# Patient Record
Sex: Male | Born: 1995 | Race: Black or African American | Hispanic: No | Marital: Single | Smoking: Never smoker
Health system: Southern US, Community
[De-identification: ages and names within clinical notes are randomized; demographics above are authoritative.]

## PROBLEM LIST (undated history)

## (undated) ENCOUNTER — Ambulatory Visit (HOSPITAL_COMMUNITY): Admission: EM | Discharge status: Against Medical Advice | Payer: Self-pay

## (undated) DIAGNOSIS — J45909 Unspecified asthma, uncomplicated: Secondary | ICD-10-CM

## (undated) HISTORY — DX: Unspecified asthma, uncomplicated: J45.909

## (undated) HISTORY — PX: WISDOM TOOTH EXTRACTION: SHX21

---
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
## 2017-09-23 IMAGING — CR DG ANKLE COMPLETE 3+V*L*
3 series · 3 of 3 positions shown · non-contrast
Comparison: None.

CLINICAL DATA: LEFT ankle pain.  Basketball injury.

EXAM:
LEFT ANKLE COMPLETE - 3+ VIEW

[x ankle ap left]
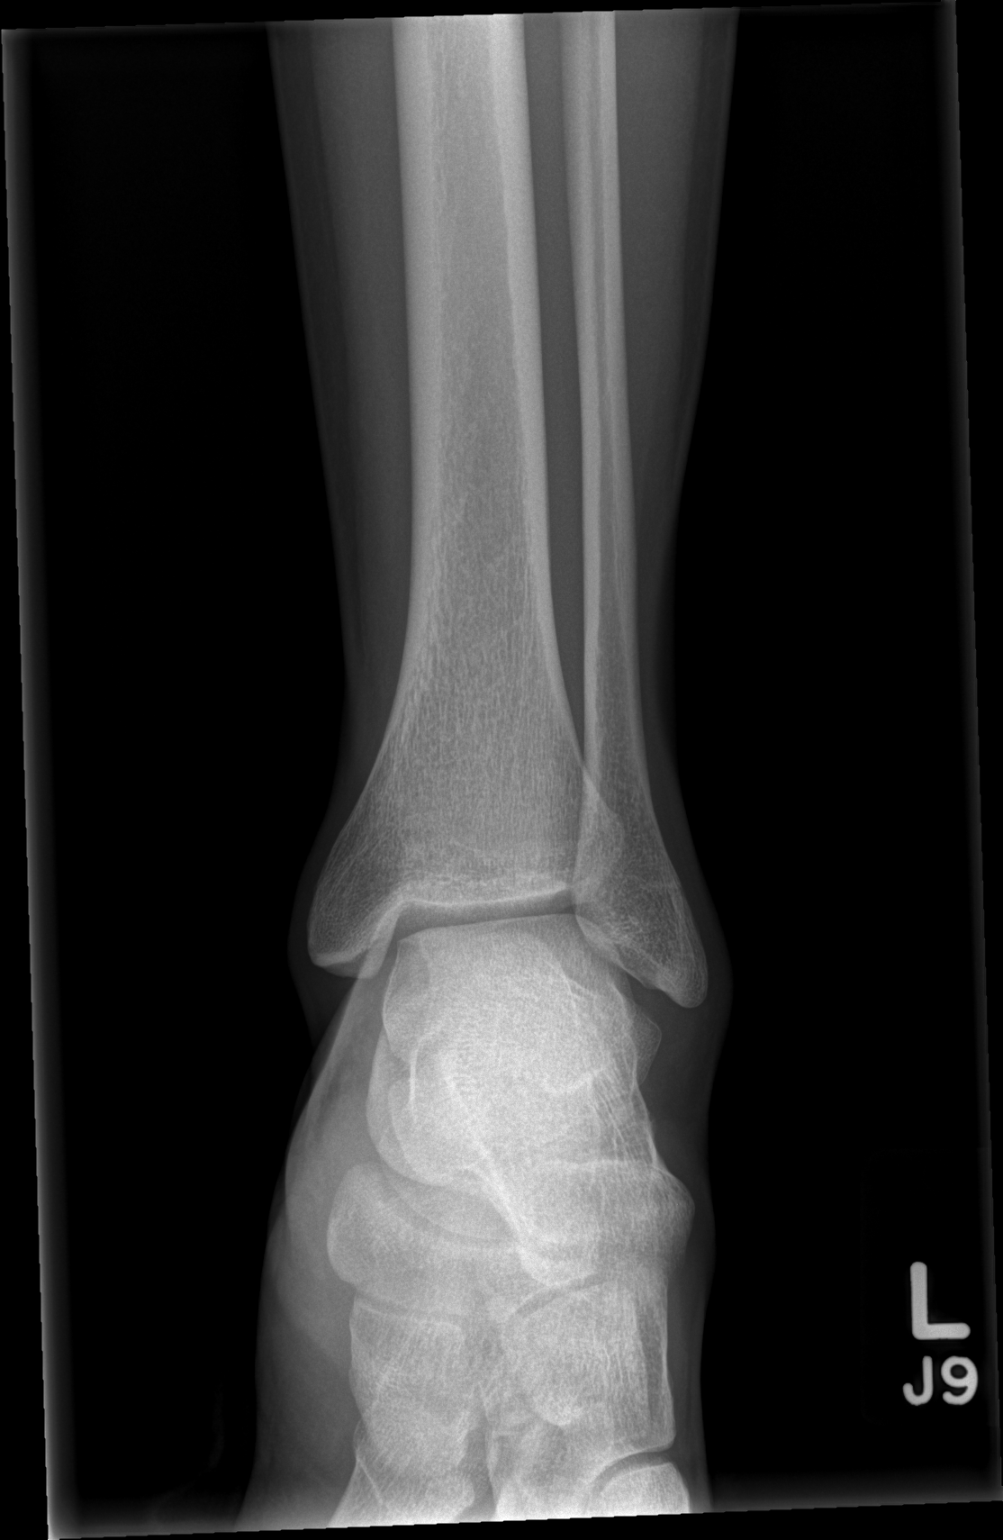

[x ankle obl left]
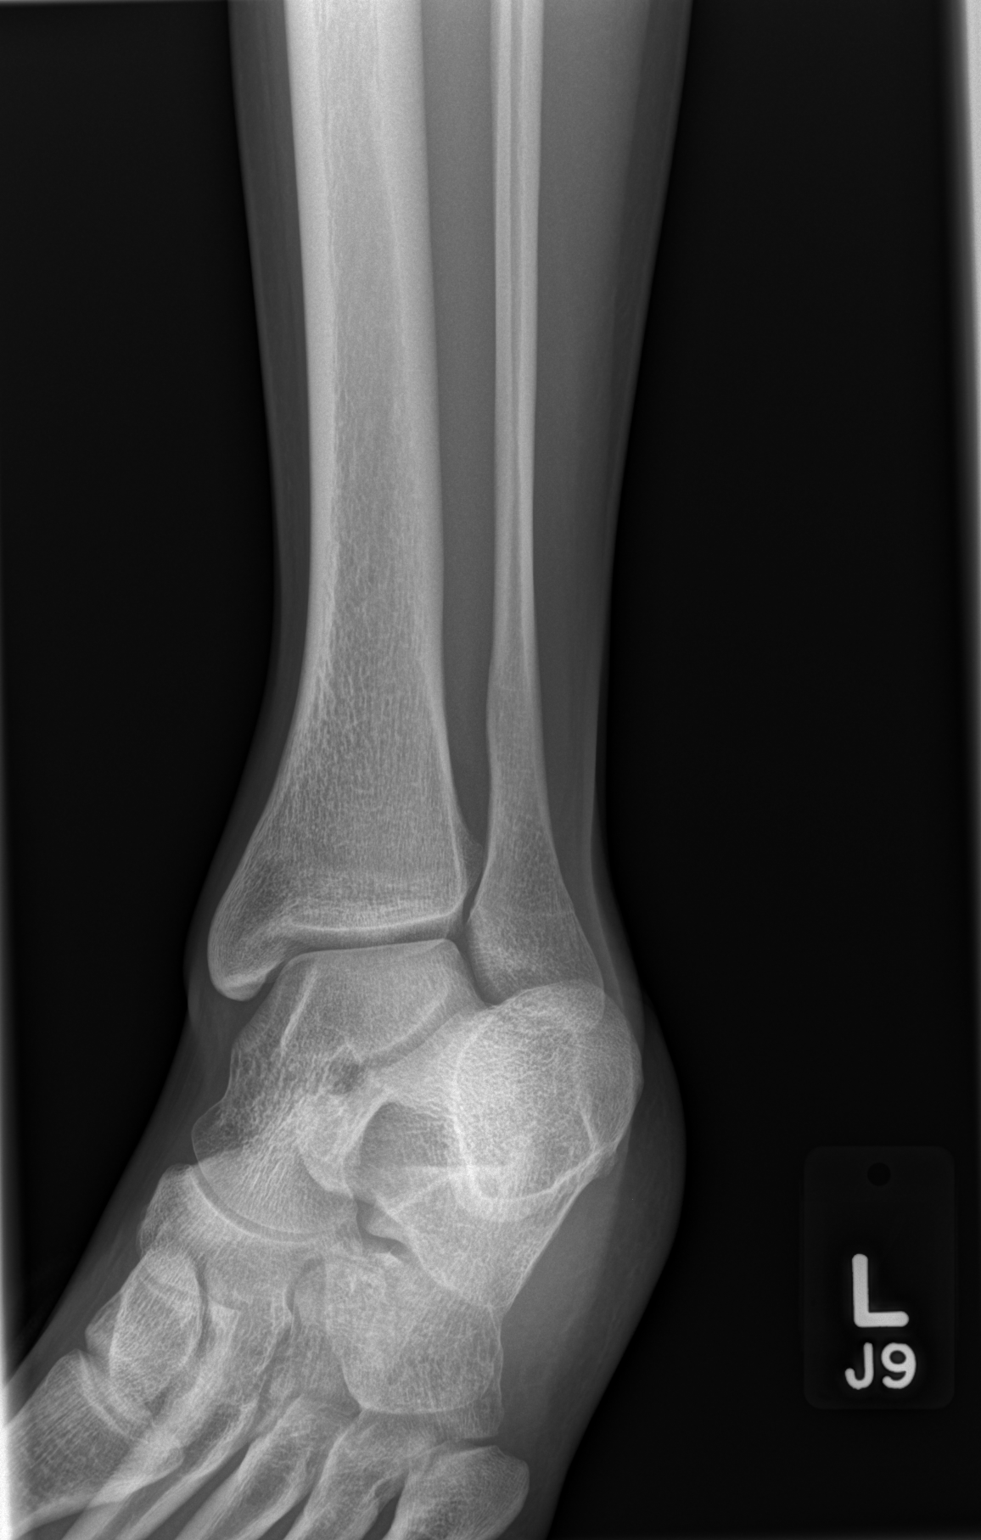

[x ankle lat left]
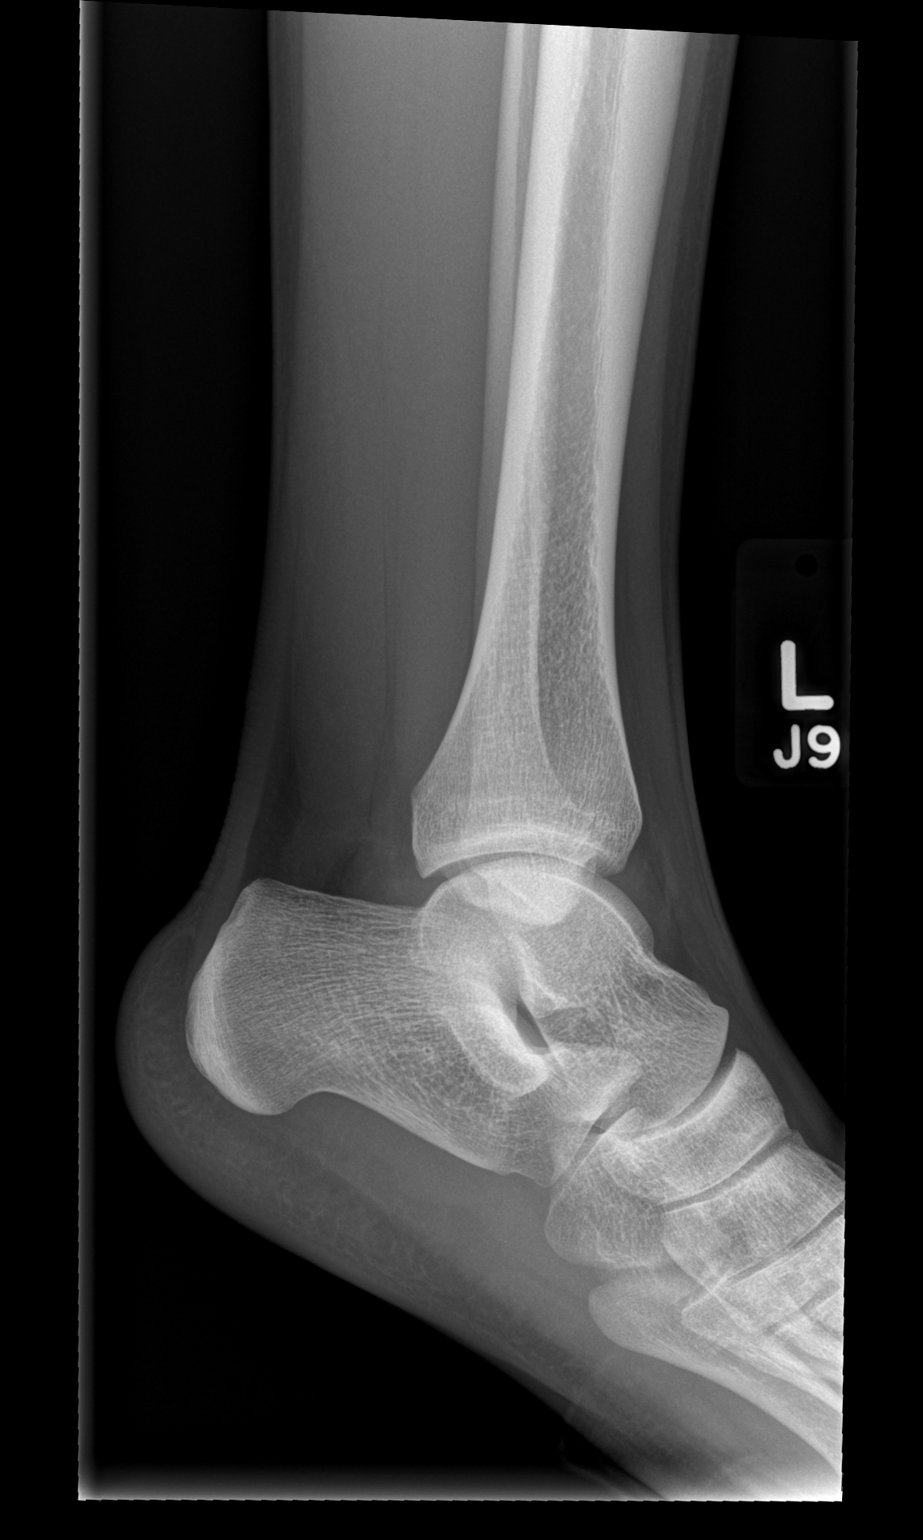

[3 of 3 positions shown; findings below may reference images not displayed]

FINDINGS: Ankle mortise intact. The talar dome is normal. No malleolar
fracture. The calcaneus is normal.
IMPRESSION: No fracture or dislocation.

## 2017-10-13 ENCOUNTER — Ambulatory Visit (INDEPENDENT_AMBULATORY_CARE_PROVIDER_SITE_OTHER): Payer: PRIVATE HEALTH INSURANCE | Admitting: Family Medicine

## 2017-10-13 ENCOUNTER — Other Ambulatory Visit (HOSPITAL_COMMUNITY)
Admission: RE | Admit: 2017-10-13 | Discharge: 2017-10-13 | Disposition: A | Discharge status: Home / Self Care | Payer: PRIVATE HEALTH INSURANCE | Source: Ambulatory Visit | Attending: Diagnostic Radiology | Admitting: Diagnostic Radiology

## 2017-10-13 ENCOUNTER — Encounter: Payer: Self-pay | Admitting: Family Medicine

## 2017-10-13 ENCOUNTER — Telehealth: Payer: Self-pay | Admitting: Family Medicine

## 2017-10-13 VITALS — BP 116/68 | HR 72 | Temp 99.2°F | Ht 67.0 in | Wt 138.0 lb

## 2017-10-13 DIAGNOSIS — Z7251 High risk heterosexual behavior: Secondary | ICD-10-CM | POA: Insufficient documentation

## 2017-10-13 DIAGNOSIS — A6001 Herpesviral infection of penis: Secondary | ICD-10-CM | POA: Diagnosis not present

## 2017-10-13 DIAGNOSIS — Z118 Encounter for screening for other infectious and parasitic diseases: Secondary | ICD-10-CM | POA: Diagnosis not present

## 2017-10-13 DIAGNOSIS — Z113 Encounter for screening for infections with a predominantly sexual mode of transmission: Secondary | ICD-10-CM | POA: Insufficient documentation

## 2017-10-13 DIAGNOSIS — Z114 Encounter for screening for human immunodeficiency virus [HIV]: Secondary | ICD-10-CM

## 2017-10-13 DIAGNOSIS — A6000 Herpesviral infection of urogenital system, unspecified: Secondary | ICD-10-CM | POA: Insufficient documentation

## 2017-10-13 MED ORDER — VALACYCLOVIR HCL 1 G PO TABS: 1000.0000 mg | ORAL_TABLET | Freq: Two times a day (BID) | ORAL | 0 refills | Status: DC

## 2017-10-13 NOTE — Telephone Encounter (Signed)
[facility] [address] [facility] on [facility].

## 2017-10-13 NOTE — Assessment & Plan Note (Addendum)
STD testing/unprotected sex/penile pain S: had sex about a week ago with new girl- unprotected sex. She was saying she was having some abdominal pain. Noted some "warts" on his penis- somewhat painful. He describes a mild to moderate "knocking" pain like someone "flicking" the penis. No radiation of pain. Pain seems to be worsening in last 24-48 hours.  A/P: Exam consistent with genital herpes- 10/2017 first episode of genital herpes. Will treat 10 days BID valtrex 1g. Then will return for recurrence- and would use lower dose then start tracking frequency of episodes. Discussed STD screening as well and patient opts in

## 2017-10-13 NOTE — Progress Notes (Signed)
Subjective:  Theodore Torres is a 22 y.o. year old very pleasant male patient who presents for/with See problem oriented charting ROS-no fever chills.  Some fatigue.  No penile discharge.  No dysuria or polyuria.  Past Medical History-  Patient Active Problem List   Diagnosis Date Noted  . Genital herpes 10/13/2017    Priority: High  . Childhood asthma 09/03/2016    Priority: Low  . 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 Mild concussion 09/08/2013    Priority: Low  . Atypical chest pain 03/05/2013    Priority: Low  . ADD (attention deficit disorder) 04/03/2012    Priority: Low  . Allergic rhinitis 02/29/2008    Priority: Low    Medications- reviewed and updated Current Outpatient Medications  Medication Sig Dispense Refill  . valACYclovir (VALTREX) 1000 MG tablet Take 1 tablet (1,000 mg total) by mouth 2 (two) times daily. 20 tablet 0   No current facility-administered medications for this visit.     Objective: BP 116/68 (BP Location: Left Arm, Patient Position: Sitting, Cuff Size: Normal)   Pulse 72   Temp 99.2 F (37.3 C) (Oral) Comment: pt just finished chewing gum  Ht 5\' 7"  (1.702 m)   Wt 138 lb (62.6 kg)   SpO2 99%   BMI 21.61 kg/m  Gen: NAD, resting comfortably CV: RRR no murmurs rubs or gallops Lungs: CTAB no crackles, wheeze, rhonchi Abdomen: soft/nontender/nondistended GU: 7 ulcerations on penis noted-slightly tender to touch Ext: no edema Skin: warm, dry  Assessment/Plan:  Genital herpes STD testing/unprotected sex/penile pain S: had sex about a week ago with new girl- unprotected sex. She was saying she was having some abdominal pain. Noted some "warts" on his penis- somewhat painful. He describes a mild to moderate "knocking" pain like someone "flicking" the penis. No radiation of pain. Pain seems to be worsening in last 24-48 hours.  A/P: Exam consistent with genital herpes- 10/2017 first episode of genital herpes. Will treat 10  days BID valtrex 1g. Then will return for recurrence- and would use lower dose then start tracking frequency of episodes. Discussed STD screening as well and patient opts in  Lab/Order associations: Screening for HIV (human immunodeficiency virus) - Plan: HIV antibody  Screening for venereal disease - Plan: RPR  Screening for gonorrhea - Plan: Urine cytology ancillary only, Urine cytology ancillary only  Screening for chlamydial disease - Plan: Urine cytology ancillary only, Urine cytology ancillary only  Unprotected sex - Plan: Urine cytology ancillary only, Urine cytology ancillary only  Meds ordered this encounter  Medications  . valACYclovir (VALTREX) 1000 MG tablet    Sig: Take 1 tablet (1,000 mg total) by mouth 2 (two) times daily.    Dispense:  20 tablet    Refill:  0    Return precautions advised.  Tana ConchStephen Gean Laursen, MD

## 2017-10-13 NOTE — Patient Instructions (Addendum)
Please stop by lab before you go   Start Valtrex 1000 mg twice a day for 10 days.  If lesions are not completely scabbed over in 10 days I can extend for another 5 days  If you have recurrence in the future I want to see you back and reevaluate- at that point we would use a lower dose of the Valtrex and give you some to have for flareups.  If you have many flareups per year we may even put you on once a day medicine to try to prevent flareups  Do not have sex while you have active lesions-at all times in the future you need to use protected sex and your partners need to be aware that you have genital herpes  He denied Genital Herpes Genital herpes is a common sexually transmitted infection (STI) that is caused by a virus. The virus spreads from person to person through sexual contact. Infection can cause itching, blisters, and sores around the genitals or rectum. Symptoms may last several days and then go away This is called an outbreak. However, the virus remains in your body, so you may have more outbreaks in the future. The time between outbreaks varies and can be months or years. Genital herpes affects men and women. It is particularly concerning for pregnant women because the virus can be passed to the baby during delivery and can cause serious problems. Genital herpes is also a concern for people who have a weak disease-fighting (immune) system. What are the causes? This condition is caused by the herpes simplex virus (HSV) type 1 or type 2. The virus may spread through:  Sexual contact with an infected person, including vaginal, anal, and oral sex.  Contact with fluid from a herpes sore.  The skin. This means that you can get herpes from an infected partner even if he or she does not have a visible sore or does not know that he or she is infected.  What increases the risk? You are more likely to develop this condition if:  You have sex with many partners.  You do not use latex  condoms during sex.  What are the signs or symptoms? Most people do not have symptoms (asymptomatic) or have mild symptoms that may be mistaken for other skin problems. Symptoms may include:  Small red bumps near the genitals, rectum, or mouth. These bumps turn into blisters and then turn into sores.  Flu-like symptoms, including: ? Fever. ? Body aches. ? Swollen lymph nodes. ? Headache.  Painful urination.  Pain and itching in the genital area or rectal area.  Vaginal discharge.  Tingling or shooting pain in the legs and buttocks.  Generally, symptoms are more severe and last longer during the first (primary) outbreak. Flu-like symptoms are also more common during the primary outbreak. How is this diagnosed? Genital herpes may be diagnosed based on:  A physical exam.  Your medical history.  Blood tests.  A test of a fluid sample (culture) from an open sore.  How is this treated? There is no cure for this condition, but treatment with antiviral medicines that are taken by mouth (orally) can do the following:  Speed up healing and relieve symptoms.  Help to reduce the spread of the virus to sexual partners.  Limit the chance of future outbreaks, or make future outbreaks shorter.  Lessen symptoms of future outbreaks.  Your health care provider may also recommend pain relief medicines, such as aspirin or ibuprofen. Follow these instructions at  home: Sexual activity  Do not have sexual contact during active outbreaks.  Practice safe sex. Latex condoms and male condoms may help prevent the spread of the herpes virus. General instructions  Keep the affected areas dry and clean.  Take over-the-counter and prescription medicines only as told by your health care provider.  Avoid rubbing or touching blisters and sores. If you do touch blisters or sores: ? Wash your hands thoroughly with soap and water. ? Do not touch your eyes afterward.  To help relieve pain or  itching, you may take the following actions as directed by your health care provider: ? Apply a cold, wet cloth (cold compress) to affected areas 4-6 times a day. ? Apply a substance that protects your skin and reduces bleeding (astringent). ? Apply a gel that helps relieve pain around sores (lidocaine gel). ? Take a warm, shallow bath that cleans the genital area (sitz bath).  Keep all follow-up visits as told by your health care provider. This is important. How is this prevented?  Use condoms. Although anyone can get genital herpes during sexual contact, even with the use of a condom, a condom can provide some protection.  Avoid having multiple sexual partners.  Talk with your sexual partner about any symptoms either of you may have. Also, talk with your partner about any history of STIs.  Get tested for STIs before you have sex. Ask your partner to do the same.  Do not have sexual contact if you have symptoms of genital herpes. Contact a health care provider if:  Your symptoms are not improving with medicine.  Your symptoms return.  You have new symptoms.  You have a fever.  You have abdominal pain.  You have redness, swelling, or pain in your eye.  You notice new sores on other parts of your body.  You are a woman and experience bleeding between menstrual periods.  You have had herpes and you become pregnant or plan to become pregnant. Summary  Genital herpes is a common sexually transmitted infection (STI) that is caused by the herpes simplex virus (HSV) type 1 or type 2.  These viruses are most often spread through sexual contact with an infected person.  You are more likely to develop this condition if you have sex with many partners or you have unprotected sex.  Most people do not have symptoms (asymptomatic) or have mild symptoms that may be mistaken for other skin problems. Symptoms occur as outbreaks that may happen months or years apart.  There is no cure  for this condition, but treatment with oral antiviral medicines can reduce symptoms, reduce the chance of spreading the virus to a partner, prevent future outbreaks, or shorten future outbreaks. This information is not intended to replace advice given to you by your health care provider. Make sure you discuss any questions you have with your health care provider. Document Released: 04/19/2000 Document Revised: 03/22/2016 Document Reviewed: 03/22/2016 Elsevier Interactive Patient Education  [employer]2018 [employer]vier Inc[employer]

## 2017-10-14 ENCOUNTER — Other Ambulatory Visit: Payer: Self-pay

## 2017-10-14 LAB — HIV ANTIBODY (ROUTINE TESTING W REFLEX): HIV 1&2 Ab, 4th Generation: NONREACTIVE

## 2017-10-14 LAB — RPR: RPR Ser Ql: NONREACTIVE

## 2017-10-14 MED ORDER — VALACYCLOVIR HCL 1 G PO TABS: 1000.0000 mg | ORAL_TABLET | Freq: Two times a day (BID) | ORAL | 0 refills | Status: DC

## 2017-10-14 NOTE — Telephone Encounter (Signed)
[facility]

## 2017-10-16 ENCOUNTER — Other Ambulatory Visit: Payer: Self-pay | Admitting: Family Medicine

## 2017-10-16 LAB — URINE CYTOLOGY ANCILLARY ONLY
CHLAMYDIA, DNA PROBE: POSITIVE — AB
Neisseria Gonorrhea: NEGATIVE
TRICH (WINDOWPATH): NEGATIVE

## 2017-10-16 MED ORDER — AZITHROMYCIN 250 MG PO TABS: ORAL_TABLET | ORAL | 0 refills | Status: DC

## 2017-10-16 NOTE — Progress Notes (Signed)
Patient has chlamydia. I sent in azithromycin for him that he needs to take all at once- please strongly encourage him to take this ASAP. If he has burnign with peeing or discharge from penis in the future- needs to follow up ASAP  HIV negative. Syphilis negative. Gonorrhea/richomonas negative.

## 2017-10-17 ENCOUNTER — Other Ambulatory Visit: Payer: Self-pay

## 2017-10-17 MED ORDER — AZITHROMYCIN 250 MG PO TABS: ORAL_TABLET | ORAL | 0 refills | Status: DC

## 2017-11-26 ENCOUNTER — Telehealth: Payer: Self-pay | Admitting: Family Medicine

## 2017-11-26 NOTE — Telephone Encounter (Signed)
Copied from CRM [id number]. Topic: Quick Communication - Rx Refill/Question >> Nov 26, 2017  9:50 AM Tamela OddiHarris, Symphony Demuro J wrote: Medication: valACYclovir (VALTREX) 1000 MG tablet / azithromycin (ZITHROMAX) 250 MG tablet  Patient called to request a refill for the above medications.  CB# [phone]  Preferred Pharmacy (with phone number or street name): [id number] [facility] [address] [phone] (Phone) [phone] (Fax)

## 2017-11-27 ENCOUNTER — Other Ambulatory Visit: Payer: Self-pay | Admitting: Family Medicine

## 2017-11-27 NOTE — Telephone Encounter (Signed)
Left message for pt. To call back about refill request.Need to know if he is having any symptoms.

## 2017-11-27 NOTE — Telephone Encounter (Signed)
May fill but tell him next time we need to have him in for a visit

## 2017-11-27 NOTE — Telephone Encounter (Addendum)
Patient called and advised he will need to schedule an OV in order to get these medications refilled. I asked if he is having another breakout, he says the breakout never completely went away, there are still a few spots. Appointment scheduled for tomorrow at 0815 with Dr. Hunter.  Azithromycin and Valtrex refill Last OV:10/13/17 Last refill:Azithromycin 10/17/17; Valtrex 6/11Durene Cal/19 20 tab/0 refill ZOX:WRUEAVPCP:Hunter [facility]: [id number] [place] [address] [facility] [address] [phone] (Phone) [phone] (Fax)

## 2017-11-27 NOTE — Telephone Encounter (Signed)
Noted  

## 2017-11-28 ENCOUNTER — Ambulatory Visit (INDEPENDENT_AMBULATORY_CARE_PROVIDER_SITE_OTHER): Payer: PRIVATE HEALTH INSURANCE | Admitting: Family Medicine

## 2017-11-28 ENCOUNTER — Encounter: Payer: Self-pay | Admitting: Family Medicine

## 2017-11-28 VITALS — BP 102/74 | HR 51 | Temp 98.6°F | Ht 67.0 in | Wt 133.8 lb

## 2017-11-28 DIAGNOSIS — A6001 Herpesviral infection of penis: Secondary | ICD-10-CM | POA: Diagnosis not present

## 2017-11-28 MED ORDER — VALACYCLOVIR HCL 500 MG PO TABS: 500.0000 mg | ORAL_TABLET | Freq: Two times a day (BID) | ORAL | 3 refills | Status: DC

## 2017-11-28 NOTE — Assessment & Plan Note (Signed)
S: A few weeks after first outbreak, patient noted a smaller less painful outbreak on left side of his penis. Mild pain and itching. He states he did have complete resolution of first outbreak though did have some slight scarring. He is not getting new lesions at present- they have just been there for a few weeks and not healing up.  A/P:  use valtrex 500mg  BID for 3 days for recurrence. Gave enough for 5 additional outbreaks. We discussed if having recurrence on monthly basis for 6 months would consider suppressive therapy with 500-1000mg  valtrex daily

## 2017-11-28 NOTE — Patient Instructions (Addendum)
Recurrent gential herpes outbreak  Treat with 500mg  valtrex twice a day for 3 days with each outbreak. Works better earlier in the course so I went ahead and gave you enough for 5 additional outbreaks. If you are still having monthly or more frequent outbreaks within 4-6 months we can consider using daily therapy. Let me know if that happens.

## 2017-11-28 NOTE — Progress Notes (Signed)
Subjective:  Theodore Torres is a 22 y.o. year old very pleasant male patient who presents for/with See problem oriented charting ROS-  no fever, chills, nausea, vomiting. Has genital lesions  Past Medical History-  Patient Active Problem List   Diagnosis Date Noted  . Genital herpes 10/13/2017    Priority: High  . Childhood asthma 09/03/2016    Priority: Low  . 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 Mild concussion 09/08/2013    Priority: Low  . Atypical chest pain 03/05/2013    Priority: Low  . ADD (attention deficit disorder) 04/03/2012    Priority: Low  . Allergic rhinitis 02/29/2008    Priority: Low    Medications- reviewed and updated Current Outpatient Medications  Medication Sig Dispense Refill  . valACYclovir (VALTREX) 1000 MG tablet Take 1 tablet (1,000 mg total) by mouth 2 (two) times daily. 20 tablet 0   No current facility-administered medications for this visit.     Objective: BP 102/74 (BP Location: Left Arm, Patient Position: Sitting, Cuff Size: Normal)   Pulse (!) 51   Temp 98.6 F (37 C) (Oral)   Ht 5\' 7"  (1.702 m)   Wt 133 lb 12.8 oz (60.7 kg)   SpO2 100%   BMI 20.96 kg/m  Gen: NAD, resting comfortably CV: slightly bradycardic but regular  no murmurs rubs or gallops Lungs: CTAB no crackles, wheeze, rhonchi Ext: no edema Skin: warm, dry GU: group of small vesicles, ulcerations at base of left penis  Assessment/Plan:  Genital herpes S: A few weeks after first outbreak, patient noted a smaller less painful outbreak on left side of his penis. Mild pain and itching. He states he did have complete resolution of first outbreak though did have some slight scarring. He is not getting new lesions at present- they have just been there for a few weeks and not healing up.  A/P:  use valtrex 500mg  BID for 3 days for recurrence. Gave enough for 5 additional outbreaks. We discussed if having recurrence on monthly basis for 6 months would  consider suppressive therapy with 500-1000mg  valtrex daily   Meds ordered this encounter  Medications  . valACYclovir (VALTREX) 500 MG tablet    Sig: Take 1 tablet (500 mg total) by mouth 2 (two) times daily. For 3 days    Dispense:  12 tablet    Refill:  3   Return precautions advised.  Tana ConchStephen Aziya Arena, MD

## 2018-03-24 ENCOUNTER — Ambulatory Visit (INDEPENDENT_AMBULATORY_CARE_PROVIDER_SITE_OTHER): Payer: PRIVATE HEALTH INSURANCE | Admitting: Family Medicine

## 2018-03-24 ENCOUNTER — Encounter: Payer: Self-pay | Admitting: Family Medicine

## 2018-03-24 ENCOUNTER — Other Ambulatory Visit (HOSPITAL_COMMUNITY)
Admission: RE | Admit: 2018-03-24 | Discharge: 2018-03-24 | Disposition: A | Discharge status: Home / Self Care | Payer: PRIVATE HEALTH INSURANCE | Source: Ambulatory Visit | Attending: Family Medicine | Admitting: Family Medicine

## 2018-03-24 VITALS — BP 108/80 | HR 54 | Temp 98.3°F | Ht 67.0 in | Wt 138.0 lb

## 2018-03-24 DIAGNOSIS — Z113 Encounter for screening for infections with a predominantly sexual mode of transmission: Secondary | ICD-10-CM | POA: Insufficient documentation

## 2018-03-24 DIAGNOSIS — Z118 Encounter for screening for other infectious and parasitic diseases: Secondary | ICD-10-CM

## 2018-03-24 DIAGNOSIS — Z114 Encounter for screening for human immunodeficiency virus [HIV]: Secondary | ICD-10-CM

## 2018-03-24 DIAGNOSIS — B370 Candidal stomatitis: Secondary | ICD-10-CM | POA: Diagnosis not present

## 2018-03-24 DIAGNOSIS — A6001 Herpesviral infection of penis: Secondary | ICD-10-CM

## 2018-03-24 MED ORDER — NYSTATIN 100000 UNIT/ML MT SUSP: 5.0000 mL | Freq: Four times a day (QID) | OROMUCOSAL | 0 refills | Status: DC

## 2018-03-24 MED ORDER — VALACYCLOVIR HCL 500 MG PO TABS: 500.0000 mg | ORAL_TABLET | Freq: Two times a day (BID) | ORAL | 3 refills | Status: DC

## 2018-03-24 NOTE — Assessment & Plan Note (Signed)
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 We treated with vlatrex 500mg  BID for recurrence for 3 days and gave enough for additional outbreaks. We discussed if having monthly outbreaks could change to suppression with valtrex 500-1000mg  daily.   He has only had one outbreak in last few months and resolved on the pills.  A/P: Getting outbreaks infrequently perhaps every few months-continue with as needed usage of Valtrex

## 2018-03-24 NOTE — Patient Instructions (Signed)
Please stop by lab before you go  Trial nystatin swish and spit 4 times a day for 5 to 6 days.  If your mouth symptoms do not go away-can trial Valtrex.  If symptoms go away with nystatin this is likely thrush.  If symptoms go away with Valtrex this could be oral herpes.  I strongly favor thrush which is a fungus in the mouth.  See us back if neither of these help.

## 2018-03-24 NOTE — Progress Notes (Signed)
Subjective:  Theodore Torres is a 22 y.o. year old very pleasant male patient who presents for/with See problem oriented charting ROS-no fever, chills, penile discharge.  Does have some whitish discoloration in the mouth.  No mouth pain.  Past Medical History-  Patient Active Problem List   Diagnosis Date Noted  . Genital herpes 10/13/2017    Priority: High  . Childhood asthma 09/03/2016    Priority: Low  . 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 Mild concussion 09/08/2013    Priority: Low  . Atypical chest pain 03/05/2013    Priority: Low  . ADD (attention deficit disorder) 04/03/2012    Priority: Low  . Allergic rhinitis 02/29/2008    Priority: Low    Medications- reviewed and updated Current Outpatient Medications  Medication Sig Dispense Refill  . valACYclovir (VALTREX) 500 MG tablet Take 1 tablet (500 mg total) by mouth 2 (two) times daily. For 3 days 12 tablet 3  . nystatin (MYCOSTATIN) 100000 UNIT/ML suspension Take 5 mLs (500,000 Units total) by mouth 4 (four) times daily. 120 mL 0   No current facility-administered medications for this visit.     Objective: BP 108/80 (BP Location: Left Arm, Patient Position: Sitting, Cuff Size: Large)   Pulse (!) 54   Temp 98.3 F (36.8 C) (Oral)   Ht 5\' 7"  (1.702 m)   Wt 138 lb (62.6 kg)   SpO2 100%   BMI 21.61 kg/m  Gen: NAD, resting comfortably Tympanic membranes normal, pharynx largely normal, and sides of mouth there is whitish discoloration which cannot be scraped off easily.  Mild small ulcerations-perhaps 1 or 2 on both sides noted-but patient admits to biting into the area to see if it would help CV: RRR no murmurs rubs or gallops Lungs: CTAB no crackles, wheeze, rhonchi Ext: no edema Skin: warm, dry  Assessment/Plan:  Whitish discoloration in mouth/bumps STD screening/unprotected sex S:No new sexual partners since last visit in July. GF ended up getting oral herpes and then later genital  herpes- no new exposures.   Not using protection right now- they have mutually agreed to this since both have herpes. He would like to retest today from last visit. Immunized against hepatitis B. I have advised him to use protection but he declines  No symptoms today other than some small bumps in mouth- not painful.  He also has some whitish discoloration in the mouth.  On the sides.  No issues on the roof the mouth or under the tongue.  His girlfriend was diagnosed with oral herpes and he wants to know if this could be herpes.  He admits that he bit into the white area to see if that would help but it did not. A/P: Whitish discoloration and mouth-I think the ulcerated area are where he tried to bite into the side of his mouth.  No pain with these so doubt herpes.  Could be thrush-we will treat with nystatin.  If he fails to improve he can trial the Valtrex-if resolves with Valtrex then likely oral herpes.  Follow-up if he fails to improve.  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 We treated with vlatrex 500mg  BID for recurrence for 3 days and gave enough for additional outbreaks. We discussed if having monthly outbreaks could change to suppression with valtrex 500-1000mg  daily.   He has only had one outbreak in last few months and resolved on the pills.  A/P: Getting outbreaks infrequently perhaps every few months-continue with as needed usage of Valtrex Also provided refills for this.  Lab/Order associations: Thrush  Screening for HIV (human immunodeficiency virus) - Plan: HIV Antibody (routine testing w rflx)  Screening examination for venereal disease - Plan: RPR  Screening for gonorrhea - Plan: Urine cytology ancillary only, Urine cytology ancillary only  Screening for chlamydial disease - Plan: Urine cytology ancillary only, Urine cytology ancillary only  Herpes simplex infection of penis  Meds ordered this encounter   Medications  . valACYclovir (VALTREX) 500 MG tablet    Sig: Take 1 tablet (500 mg total) by mouth 2 (two) times daily. For 3 days    Dispense:  12 tablet    Refill:  3  . nystatin (MYCOSTATIN) 100000 UNIT/ML suspension    Sig: Take 5 mLs (500,000 Units total) by mouth 4 (four) times daily.    Dispense:  120 mL    Refill:  0   Return precautions advised.  Tana Conch, MD

## 2018-03-25 LAB — HIV ANTIBODY (ROUTINE TESTING W REFLEX): HIV: NONREACTIVE

## 2018-03-25 LAB — URINE CYTOLOGY ANCILLARY ONLY
Chlamydia: POSITIVE — AB
Neisseria Gonorrhea: NEGATIVE
Trichomonas: NEGATIVE

## 2018-03-25 LAB — RPR: RPR: NONREACTIVE

## 2018-03-26 MED ORDER — AZITHROMYCIN 250 MG PO TABS: ORAL_TABLET | ORAL | 0 refills | Status: DC

## 2018-03-26 NOTE — Addendum Note (Signed)
Addended by: Shelva MajesticHUNTER, Tiyanna Larcom O on: 03/26/2018 07:50 AM   Modules accepted: Orders

## 2019-03-12 IMAGING — DX DG HAND COMPLETE 3+V*R*
3 series · 3 of 3 positions shown · non-contrast
Comparison: No recent prior.

CLINICAL DATA: Injury.

EXAM:
RIGHT HAND - COMPLETE 3+ VIEW

[hand pa]
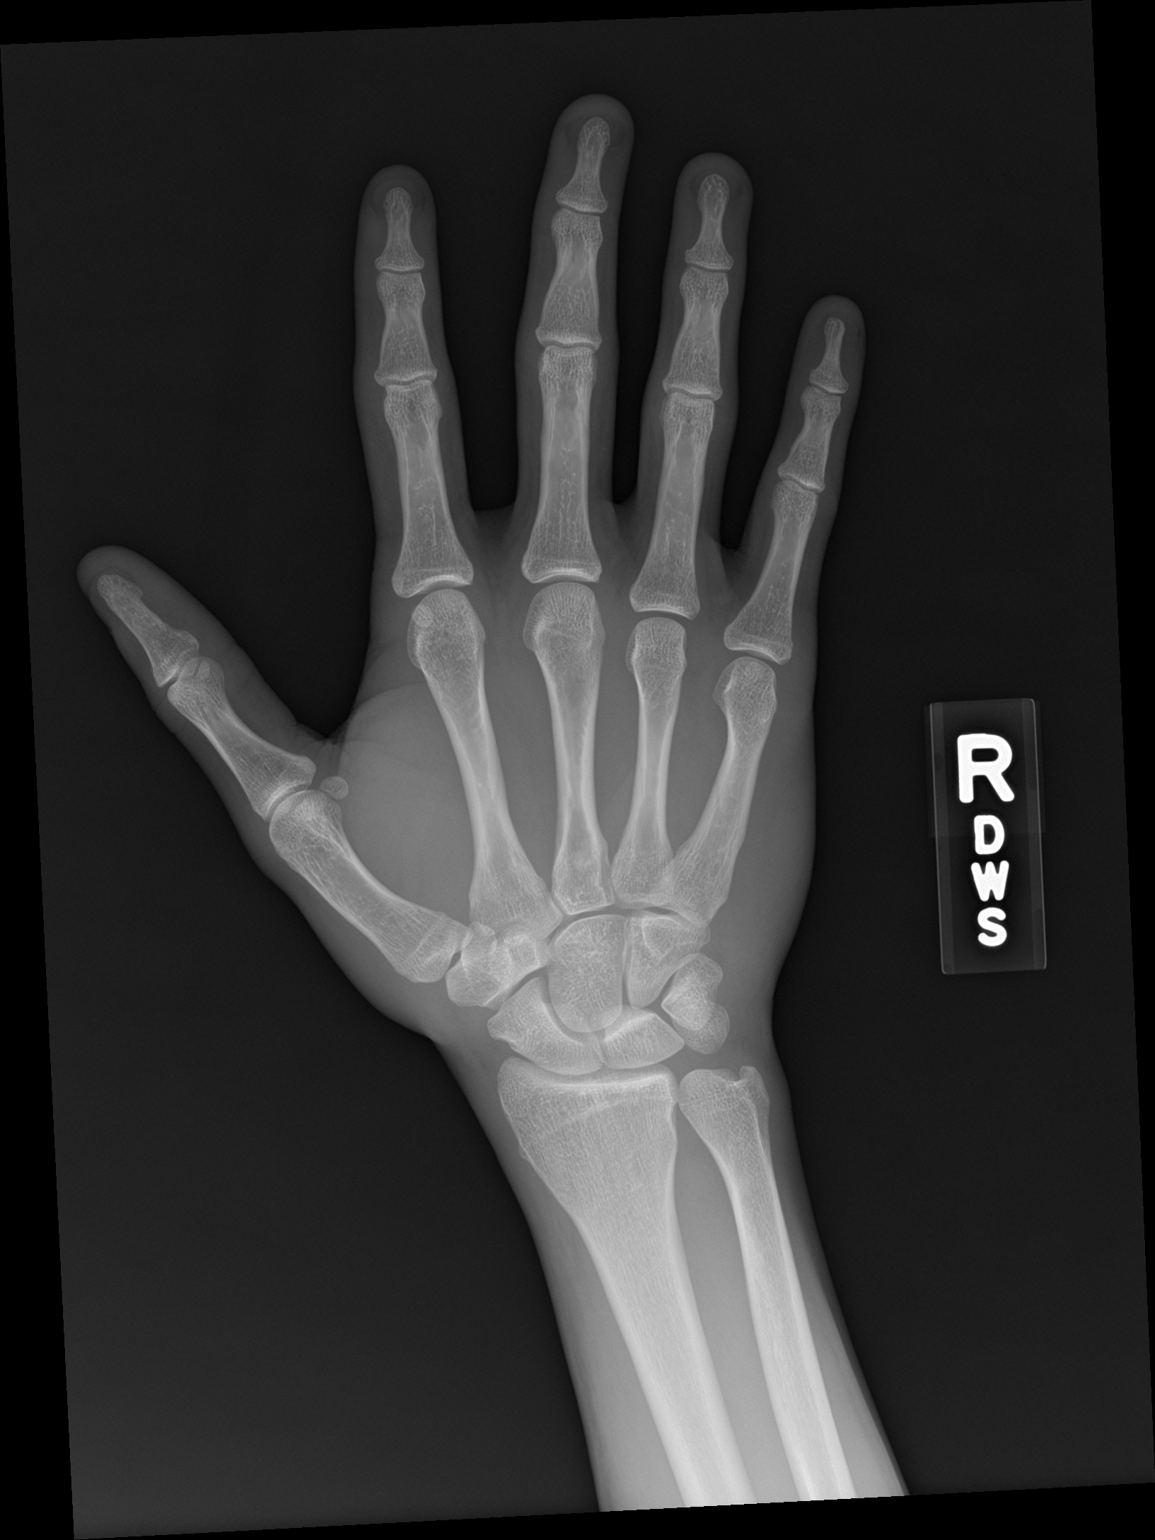

[hand obl]
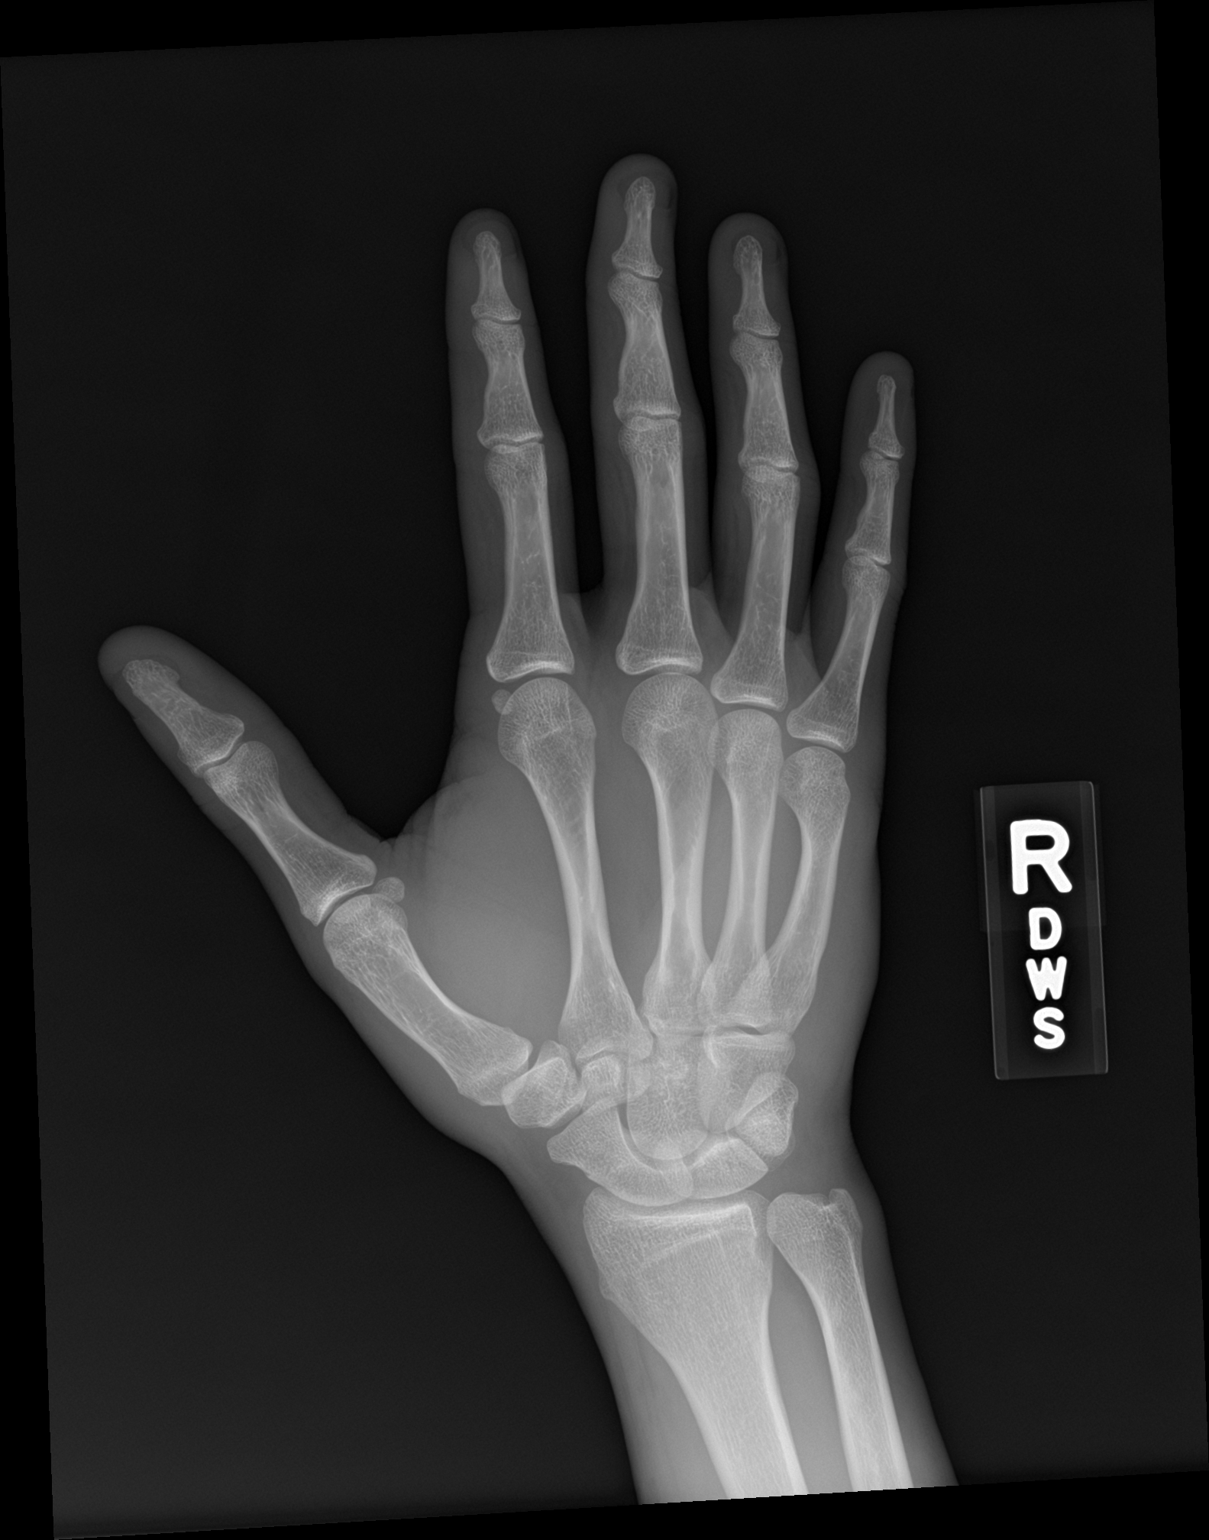

[hand lat]
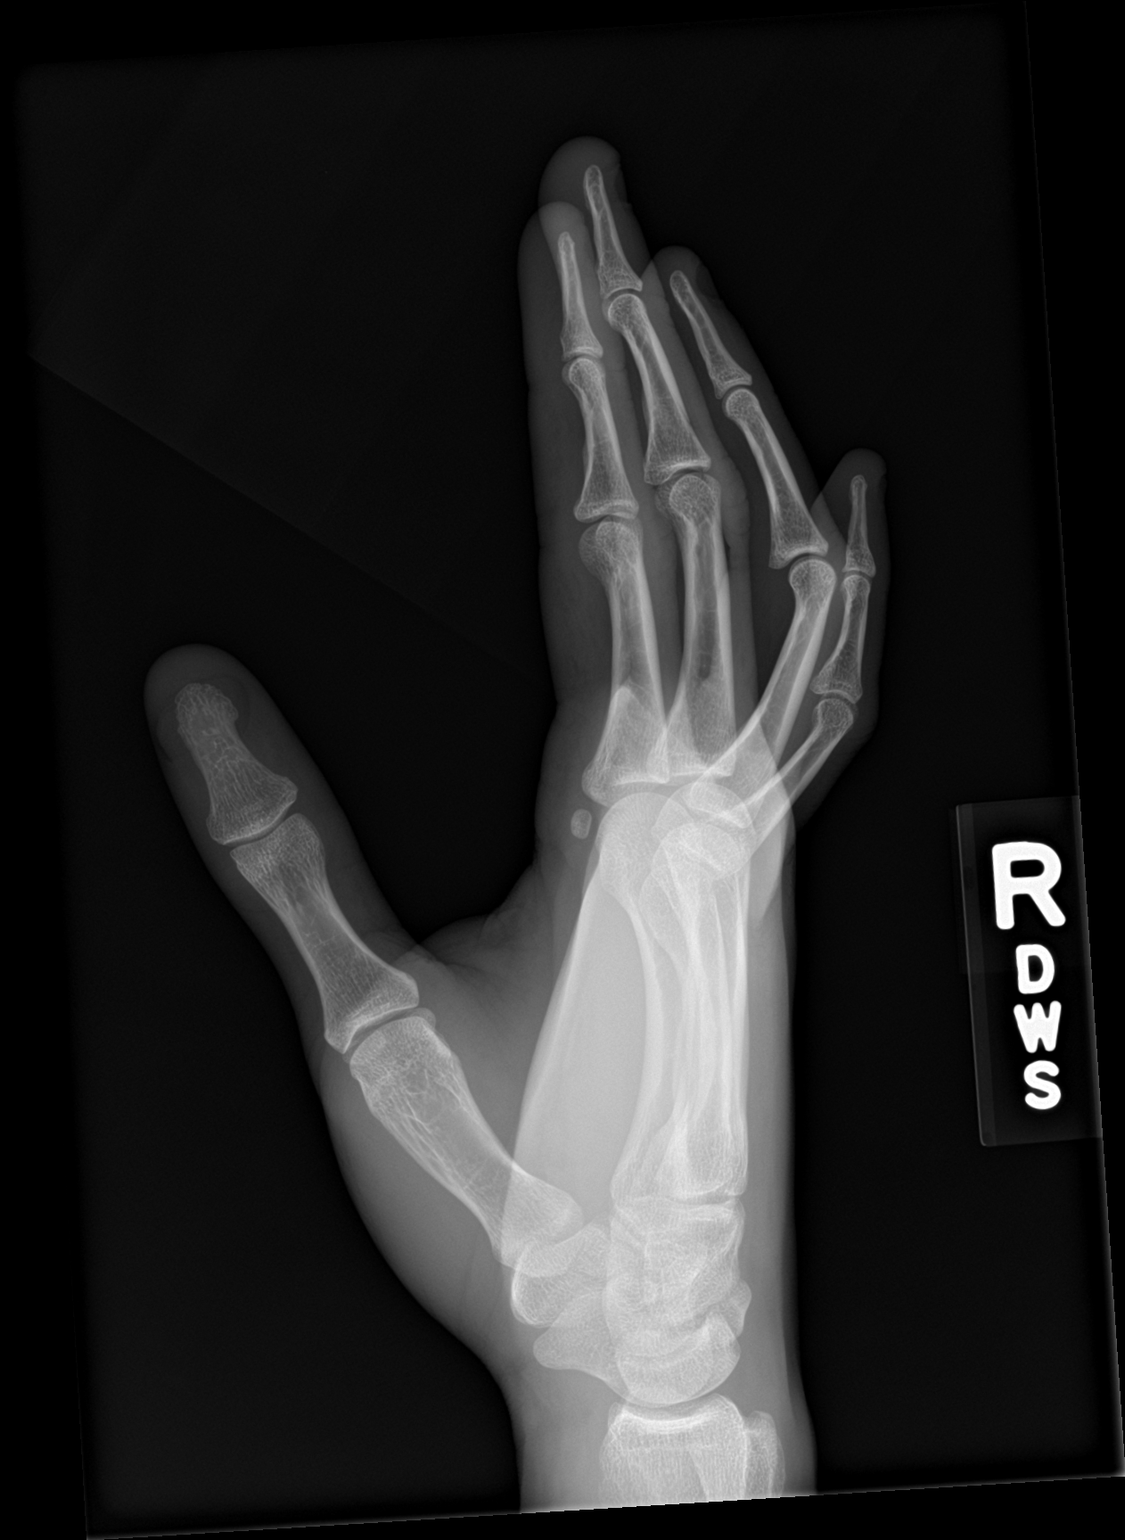

[3 of 3 positions shown; findings below may reference images not displayed]

FINDINGS: No acute bony or joint abnormality identified. No evidence of
fracture or dislocation.
IMPRESSION: No acute abnormality.

## 2019-03-28 ENCOUNTER — Other Ambulatory Visit: Payer: Self-pay | Admitting: Family Medicine

## 2020-08-29 ENCOUNTER — Telehealth: Payer: Self-pay

## 2020-08-29 MED ORDER — VALACYCLOVIR HCL 500 MG PO TABS: 500.0000 mg | ORAL_TABLET | Freq: Two times a day (BID) | ORAL | 3 refills | Status: DC

## 2020-08-29 NOTE — Telephone Encounter (Signed)
Refill sent.

## 2020-08-29 NOTE — Telephone Encounter (Signed)
  LAST APPOINTMENT DATE: 03/2018   NEXT APPOINTMENT DATE:@5 /03/2021  MEDICATION:valACYclovir (VALTREX) 500 MG tablet   PHARMACY:[id number] [place], [place] - [address]  Comments: Patient is requesting a short supply to last until his appointment in May.   Please advise

## 2020-09-12 NOTE — Patient Instructions (Incomplete)
Health Maintenance Due  Topic Date Due  . HPV VACCINES (3 - Male 3-dose series) 11/28/2014  . TETANUS/TDAP  09/29/2018   Please stop by lab before you go If you have mychart- we will send your results within 3 business days of [place] receiving them.  If you do not have mychart- we will call you about results within 5 business days of [place] receiving them.  *please also note that you will see labs on mychart as soon as they post. I will later go in and write notes on them- will say "notes from Dr. Durene Cal"   Depression screen [facility] 2/9 10/13/2017  Decreased Interest 0  Down, Depressed, Hopeless 0  PHQ - 2 Score 0

## 2020-09-13 ENCOUNTER — Ambulatory Visit: Payer: Medicaid Other | Admitting: Family Medicine

## 2020-09-13 DIAGNOSIS — Z1159 Encounter for screening for other viral diseases: Secondary | ICD-10-CM

## 2020-09-26 ENCOUNTER — Other Ambulatory Visit: Payer: Self-pay

## 2020-09-26 ENCOUNTER — Ambulatory Visit: Payer: Medicaid Other | Admitting: Family Medicine

## 2020-09-26 ENCOUNTER — Encounter: Payer: Self-pay | Admitting: Family Medicine

## 2020-09-26 ENCOUNTER — Other Ambulatory Visit (HOSPITAL_COMMUNITY)
Admission: RE | Admit: 2020-09-26 | Discharge: 2020-09-26 | Disposition: A | Discharge status: Home / Self Care | Payer: Medicaid Other | Source: Ambulatory Visit | Attending: Family Medicine | Admitting: Family Medicine

## 2020-09-26 VITALS — BP 118/82 | HR 63 | Temp 98.4°F | Ht 67.0 in | Wt 129.6 lb

## 2020-09-26 DIAGNOSIS — Z113 Encounter for screening for infections with a predominantly sexual mode of transmission: Secondary | ICD-10-CM | POA: Insufficient documentation

## 2020-09-26 DIAGNOSIS — A6001 Herpesviral infection of penis: Secondary | ICD-10-CM | POA: Diagnosis not present

## 2020-09-26 DIAGNOSIS — Z13220 Encounter for screening for lipoid disorders: Secondary | ICD-10-CM

## 2020-09-26 DIAGNOSIS — Z118 Encounter for screening for other infectious and parasitic diseases: Secondary | ICD-10-CM

## 2020-09-26 DIAGNOSIS — Z1159 Encounter for screening for other viral diseases: Secondary | ICD-10-CM

## 2020-09-26 DIAGNOSIS — Z114 Encounter for screening for human immunodeficiency virus [HIV]: Secondary | ICD-10-CM

## 2020-09-26 LAB — LIPID PANEL
Cholesterol: 164 mg/dL (ref 0–200)
HDL: 60.9 mg/dL (ref >39.00)
LDL Cholesterol: 91 mg/dL (ref 0–99)
NonHDL: 102.62
Total CHOL/HDL Ratio: 3
Triglycerides: 60 mg/dL (ref 0.0–149.0)
VLDL: 12 mg/dL (ref 0.0–40.0)

## 2020-09-26 NOTE — Addendum Note (Signed)
Addended by: Vincenza Hews on: 09/26/2020 10:39 AM   Modules accepted: Orders

## 2020-09-26 NOTE — Patient Instructions (Addendum)
Health Maintenance Due  Topic Date Due  . HPV VACCINES (3 - Male 3-dose series) - declines for now 11/28/2014  . TETANUS/TDAP- declines for now- you would be due if you get a cut/scrape 09/29/2018   Please stop by lab before you go If you have mychart- we will send your results within 3 business days of [place] receiving them.  If you do not have mychart- we will call you about results within 5 business days of [place] receiving them.  *please also note that you will see labs on mychart as soon as they post. I will later go in and write notes on them- will say "notes from Dr. Durene Cal"   Recommended follow up: 1-2 year CPE was to be verbally advised

## 2020-09-26 NOTE — Progress Notes (Signed)
Phone [phone] In person visit   Subjective:   Theodore Torres is a 25 y.o. year old very pleasant male patient who presents for/with See problem oriented charting Chief Complaint  Patient presents with  . Medication Refill   This visit occurred during the SARS-CoV-2 public health emergency.  Safety protocols were in place, including screening questions prior to the visit, additional usage of staff PPE, and extensive cleaning of exam room while observing appropriate contact time as indicated for disinfecting solutions.   Past Medical History-  Patient Active Problem List   Diagnosis Date Noted  . Genital herpes 10/13/2017    Priority: High  . Childhood asthma 09/03/2016    Priority: Low  . 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 Mild concussion 09/08/2013    Priority: Low  . Atypical chest pain 03/05/2013    Priority: Low  . ADD (attention deficit disorder) 04/03/2012    Priority: Low  . Allergic rhinitis 02/29/2008    Priority: Low    Medications- reviewed and updated Current Outpatient Medications  Medication Sig Dispense Refill  . valACYclovir (VALTREX) 500 MG tablet Take 1 tablet (500 mg total) by mouth 2 (two) times daily. For 3 days 12 tablet 3   No current facility-administered medications for this visit.     Objective:  BP 118/82   Pulse 63   Temp 98.4 F (36.9 C) (Temporal)   Ht 5\' 7"  (1.702 m)   Wt 129 lb 9.6 oz (58.8 kg)   SpO2 98%   BMI 20.30 kg/m  Gen: NAD, resting comfortably CV: RRR no murmurs rubs or gallops, no carotid bruits Lungs: CTAB no crackles, wheeze, rhonchi Abdomen: soft/nontender/nondistended/normal bowel sounds. No rebound or guarding.  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 Has completed High school.  He is very focused on music and enjoys being on stage to perform. He is single at the current  moment. Diet-Not well controlled Exercise- Occasional push ups-denies participating in formal exercise-encouraged patient to consider f3 or some type of regular exercise   # Herpes simplex S: He is not reporting any recent flare ups-other than the 1 that we prescribed him Valtrex for recently-other than that has not had a flare in years. Compliant with medication when he has flareups. He is using protection during intercourse. A/P: Thrilled patient is doing well but wanted to have Valtrex on hand-he was given 3 refills last month and encouraged him to pick up if needed  # Marijuana Use S: smokes daily- never cigarettes. Does not smoke while driving. 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 A/P: Strongly encourage cessation-patient agrees to consider  # Immunizations declines Tetanus vaccine.  Declines HPV vaccination  Recommended follow up: 1-2 year CPE was to be verbally advised Lab/Order associations:   ICD-10-CM   1. Herpes simplex infection of penis  A60.01   2. Screening for hyperlipidemia  Z13.220 Lipid panel  3. Screening for gonorrhea  Z11.3 Urine cytology ancillary only  4. Screening for chlamydial disease  Z11.8 Urine cytology ancillary only  5. Screening examination for venereal disease  Z11.3 RPR  6. Screening for HIV (human immunodeficiency virus)  Z11.4 HIV Antibody (routine testing w rflx)  7. Encounter for hepatitis C screening test for low risk patient  Z11.59 Hepatitis C antibody   Time Spent: 20 minutes of  total time (10:02 AM- 10:22) was spent on the date of the encounter performing the following actions: chart review prior to seeing the patient, obtaining history, performing a medically necessary exam, counseling on the treatment plan, placing orders, and documenting in our EHR.   I,Harris Phan,acting as a Neurosurgeon for Tana Conch, MD.,have documented all relevant documentation on the behalf of Tana Conch, MD,as directed by  Tana Conch, MD while in the  presence of Tana Conch, MD.   I, Tana Conch, MD, have reviewed all documentation for this visit. The documentation on 09/26/20 for the exam, diagnosis, procedures, and orders are all accurate and complete.   Return precautions advised.  Tana Conch, MD

## 2020-09-27 LAB — HEPATITIS C ANTIBODY
Hepatitis C Ab: NONREACTIVE
SIGNAL TO CUT-OFF: 0.01 (ref <1.00)

## 2020-09-27 LAB — URINE CYTOLOGY ANCILLARY ONLY
Chlamydia: NEGATIVE
Comment: NEGATIVE
Comment: NEGATIVE
Comment: NORMAL
Neisseria Gonorrhea: NEGATIVE
Trichomonas: NEGATIVE

## 2020-09-27 LAB — HIV ANTIBODY (ROUTINE TESTING W REFLEX): HIV 1&2 Ab, 4th Generation: NONREACTIVE

## 2020-09-27 LAB — RPR: RPR Ser Ql: NONREACTIVE

## 2021-03-20 ENCOUNTER — Other Ambulatory Visit: Payer: Self-pay

## 2021-03-20 ENCOUNTER — Ambulatory Visit (INDEPENDENT_AMBULATORY_CARE_PROVIDER_SITE_OTHER): Payer: Medicaid Other | Admitting: Family Medicine

## 2021-03-20 ENCOUNTER — Encounter: Payer: Self-pay | Admitting: Family Medicine

## 2021-03-20 ENCOUNTER — Other Ambulatory Visit (HOSPITAL_COMMUNITY)
Admission: RE | Admit: 2021-03-20 | Discharge: 2021-03-20 | Disposition: A | Discharge status: Home / Self Care | Payer: Medicaid Other | Source: Ambulatory Visit | Attending: Family Medicine | Admitting: Family Medicine

## 2021-03-20 VITALS — BP 110/70 | HR 59 | Temp 98.0°F | Ht 67.0 in | Wt 134.0 lb

## 2021-03-20 DIAGNOSIS — Z113 Encounter for screening for infections with a predominantly sexual mode of transmission: Secondary | ICD-10-CM | POA: Insufficient documentation

## 2021-03-20 DIAGNOSIS — Z118 Encounter for screening for other infectious and parasitic diseases: Secondary | ICD-10-CM | POA: Insufficient documentation

## 2021-03-20 DIAGNOSIS — Z114 Encounter for screening for human immunodeficiency virus [HIV]: Secondary | ICD-10-CM | POA: Diagnosis not present

## 2021-03-20 DIAGNOSIS — A6001 Herpesviral infection of penis: Secondary | ICD-10-CM | POA: Diagnosis not present

## 2021-03-20 DIAGNOSIS — Z23 Encounter for immunization: Secondary | ICD-10-CM

## 2021-03-20 MED ORDER — VALACYCLOVIR HCL 500 MG PO TABS: 500.0000 mg | ORAL_TABLET | Freq: Two times a day (BID) | ORAL | 5 refills | Status: DC

## 2021-03-20 NOTE — Addendum Note (Signed)
Addended by: Lieutenant Diego A on: 03/20/2021 11:54 AM   Modules accepted: Orders

## 2021-03-20 NOTE — Patient Instructions (Addendum)
Health Maintenance Due  Topic Date Due   Pneumococcal Vaccine 13-23 Years old (1 - PCV)- due to childhood asthma- ddeclines for now 08/02/2003   HPV VACCINES (3 - Male 3-dose series)- declines for now 11/28/2014   TETANUS/TDAP - today 09/29/2018   Please stop by lab before you go If you have mychart- we will send your results within 3 business days of [place] receiving them.  If you do not have mychart- we will call you about results within 5 business days of [place] receiving them.  *please also note that you will see labs on mychart as soon as they post. I will later go in and write notes on them- will say "notes from Dr. Durene Cal"  Would love to see you quit marijuana  Consider [email or web address] for free workouts outdoors bootcamp style  Recommended follow up: Return in about 6 months (around 09/17/2021) for physical or sooner if needed.

## 2021-03-20 NOTE — Addendum Note (Signed)
Addended by: Lorn Junes on: 03/20/2021 11:52 AM   Modules accepted: Orders

## 2021-03-20 NOTE — Progress Notes (Signed)
Phone [phone] In person visit   Subjective:   Theodore Torres is a 25 y.o. year old very pleasant male patient who presents for/with See problem oriented charting Chief Complaint  Patient presents with   STD testing    Pt would like to have STD testing just to be safe, he does have a new partner.   This visit occurred during the SARS-CoV-2 public health emergency.  Safety protocols were in place, including screening questions prior to the visit, additional usage of staff PPE, and extensive cleaning of exam room while observing appropriate contact time as indicated for disinfecting solutions.   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 For 3 days 12 tablet 5   No current facility-administered medications for this visit.     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 Not always using protection- wants to screen for STDs  # Herpes Simplex S:medications: compliant Valtrex 500 mg BID as needed Reports having  1 flares in the past 6 months.  Patient uses protection during intercourse. -discussed can transmit even without outbreak and recommended using protection but does not guarantee lack of transmission A/P: stable overall- continue current meds- does not need/want daily suppression at this point   # Marijuana use  S:smokes daily - never cigarettes . Does not smoke while driving . 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  A/P: Strongly encourage cessation-patient agrees to consider   Recommended follow up: Return in about 6 months (around 09/17/2021) for physical or sooner if needed.  Lab/Order associations:   ICD-10-CM   1. Herpes simplex infection of penis  A60.01     2. Screening for STD (sexually transmitted disease)  Z11.3     3. Screening for HIV (human immunodeficiency virus)  Z11.4 HIV Antibody (routine testing w rflx)    4. Screening examination for venereal disease  Z11.3 RPR    5. Screening for gonorrhea  Z11.3 Urine cytology ancillary only    6. Screening for chlamydial disease  Z11.8 Urine cytology ancillary only     Meds ordered this encounter  Medications   valACYclovir (VALTREX) 500 MG tablet    Sig: Take 1 tablet (500 mg total) by mouth 2 (two) times daily. For 3 days    Dispense:  12 tablet    Refill:  5   I,Harris Phan,acting  as a scribe for Garret Reddish, MD.,have documented all relevant documentation on the behalf of Garret Reddish, MD,as directed by  Garret Reddish, MD while in the presence of Garret Reddish, MD.  I, Garret Reddish, MD, have reviewed all documentation for this visit. The documentation on 03/20/21 for the exam, diagnosis, procedures, and orders are all accurate and complete.  Return precautions advised.  Garret Reddish, MD

## 2021-03-21 LAB — URINE CYTOLOGY ANCILLARY ONLY
Chlamydia: NEGATIVE
Comment: NEGATIVE
Comment: NEGATIVE
Comment: NORMAL
Neisseria Gonorrhea: NEGATIVE
Trichomonas: NEGATIVE

## 2021-03-21 LAB — HIV ANTIBODY (ROUTINE TESTING W REFLEX): HIV 1&2 Ab, 4th Generation: NONREACTIVE

## 2021-03-21 LAB — RPR: RPR Ser Ql: NONREACTIVE

## 2022-07-03 ENCOUNTER — Emergency Department (HOSPITAL_BASED_OUTPATIENT_CLINIC_OR_DEPARTMENT_OTHER)
Admission: EM | Admit: 2022-07-03 | Discharge: 2022-07-03 | Disposition: A | Discharge status: Home / Self Care | Payer: Medicaid Other | Attending: Emergency Medicine | Admitting: Emergency Medicine

## 2022-07-03 ENCOUNTER — Other Ambulatory Visit (HOSPITAL_BASED_OUTPATIENT_CLINIC_OR_DEPARTMENT_OTHER): Payer: Self-pay

## 2022-07-03 ENCOUNTER — Other Ambulatory Visit: Payer: Self-pay

## 2022-07-03 ENCOUNTER — Encounter (HOSPITAL_BASED_OUTPATIENT_CLINIC_OR_DEPARTMENT_OTHER): Payer: Self-pay

## 2022-07-03 DIAGNOSIS — M5432 Sciatica, left side: Secondary | ICD-10-CM | POA: Diagnosis not present

## 2022-07-03 DIAGNOSIS — W208XXA Other cause of strike by thrown, projected or falling object, initial encounter: Secondary | ICD-10-CM | POA: Diagnosis not present

## 2022-07-03 DIAGNOSIS — Y990 Civilian activity done for income or pay: Secondary | ICD-10-CM | POA: Diagnosis not present

## 2022-07-03 DIAGNOSIS — S39012A Strain of muscle, fascia and tendon of lower back, initial encounter: Secondary | ICD-10-CM | POA: Insufficient documentation

## 2022-07-03 DIAGNOSIS — M545 Low back pain, unspecified: Secondary | ICD-10-CM | POA: Diagnosis present

## 2022-07-03 LAB — URINALYSIS, ROUTINE W REFLEX MICROSCOPIC
Bilirubin Urine: NEGATIVE
Glucose, UA: NEGATIVE mg/dL
Hgb urine dipstick: NEGATIVE
Ketones, ur: NEGATIVE mg/dL
Leukocytes,Ua: NEGATIVE
Nitrite: NEGATIVE
Protein, ur: NEGATIVE mg/dL
Specific Gravity, Urine: 1.025 (ref 1.005–1.030)
pH: 5.5 (ref 5.0–8.0)

## 2022-07-03 MED ORDER — KETOROLAC TROMETHAMINE 15 MG/ML IJ SOLN
15.0000 mg | Freq: Once | INTRAMUSCULAR | Status: AC
  Administered 2022-07-03: 15 mg via INTRAMUSCULAR
  Filled 2022-07-03: qty 1

## 2022-07-03 MED ORDER — IBUPROFEN 600 MG PO TABS
600.0000 mg | ORAL_TABLET | Freq: Three times a day (TID) | ORAL | 0 refills | Status: AC | PRN
  Filled 2022-07-03: qty 15, 5d supply, fill #0

## 2022-07-03 MED ORDER — CYCLOBENZAPRINE HCL 10 MG PO TABS
10.0000 mg | ORAL_TABLET | Freq: Three times a day (TID) | ORAL | 0 refills | Status: AC | PRN
  Filled 2022-07-03: qty 15, 5d supply, fill #0

## 2022-07-03 NOTE — ED Notes (Signed)
Dc instructions reviewed with patient. Patient voiced understanding. Dc with belongings.  °

## 2022-07-03 NOTE — ED Provider Notes (Signed)
[facility] Provider Note   CSN: [id number] Arrival date & time: 07/03/22  1443     History  Chief Complaint  Patient presents with   Flank Pain    Theodore Torres is a 27 y.o. male with remote history of asthma who presents to the ED complaining of right lower back pain for the last 2 weeks.  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  He has no history of chronic back pain.  He notes that the pain seems to go around to his right hip/buttock and to his right thigh.  He denies numbness, tingling, weakness in the legs, fever, chills, abdominal pain, nausea, vomiting, fever, chills, bowel or bladder dysfunction, urinary symptoms, or any other complaints today.  He is able to ambulate without difficulty. He denies history of IVDU or cancer.        Home Medications Prior to Admission medications   Medication Sig Start Date End Date Taking? Authorizing Provider  cyclobenzaprine (FLEXERIL) 10 MG tablet Take 1 tablet (10 mg total) by mouth 3 (three) times daily as needed for up to 5 days for muscle spasms. 07/03/22 07/08/22 Yes Jillianne Gamino L, PA-C  ibuprofen (ADVIL) 600 MG tablet Take 1 tablet (600 mg total) by mouth every 8 (eight) hours as needed for up to 5 days for mild pain or moderate pain. 07/03/22 07/08/22 Yes Karina Lenderman L, PA-C  valACYclovir (VALTREX) 500 MG tablet Take 1 tablet (500 mg total) by mouth 2 (two) times daily. For 3 days 03/20/21   Marin Olp, MD      Allergies    Patient has no known allergies.    Review of Systems   Review of Systems  All other systems reviewed and are negative.   Physical Exam Updated Vital Signs BP (!) 134/49 (BP Location: Right Arm)   Pulse (!) 58   Temp 98.1 F (36.7 C)   Resp 16   Ht '5\' 7"'$  (1.702 m)   Wt 63.5 kg   SpO2 100%   BMI 21.93 kg/m  Physical Exam Vitals and  nursing note reviewed.  Constitutional:      General: He is not in acute distress.    Appearance: Normal appearance. He is not ill-appearing, toxic-appearing or diaphoretic.  HENT:     Head: Normocephalic and atraumatic.     Mouth/Throat:     Mouth: Mucous membranes are moist.  Eyes:     General: No scleral icterus.    Conjunctiva/sclera: Conjunctivae normal.  Cardiovascular:     Rate and Rhythm: Normal rate and regular rhythm.  Pulmonary:     Effort: Pulmonary effort is normal.     Breath sounds: Normal breath sounds.  Abdominal:     General: Abdomen is flat. There is no distension.     Palpations: Abdomen is soft.     Tenderness: There is no abdominal tenderness. There is no right CVA tenderness, left CVA tenderness, guarding or rebound.  Musculoskeletal:        General: Normal range of motion.     Cervical back: Normal range of motion and neck supple. No rigidity or tenderness.     Right lower leg: No edema.     Left lower leg: No edema.     Comments: No midline CTL spine tenderness, stepoffs, or deformities, moderate tenderness over  right lumbar area paraspinous muscles with mild spasming, no tenderness over right hip or thigh  Skin:    General: Skin is warm and dry.     Capillary Refill: Capillary refill takes less than 2 seconds.  Neurological:     Mental Status: He is alert and oriented to person, place, and time.     GCS: GCS eye subscore is 4. GCS verbal subscore is 5. GCS motor subscore is 6.     Cranial Nerves: Cranial nerves 2-12 are intact.     Sensory: Sensation is intact.     Motor: Motor function is intact. No weakness, tremor, atrophy or abnormal muscle tone.     Gait: Gait is intact.     Comments: 5/5 strength bilateral UE and LE  Psychiatric:        Mood and Affect: Mood normal.        Behavior: Behavior normal.     ED Results / Procedures / Treatments   Labs (all labs ordered are listed, but only abnormal results are displayed) Labs Reviewed   URINALYSIS, ROUTINE W REFLEX MICROSCOPIC    EKG None  Radiology No results found.  Procedures Procedures    Medications Ordered in ED Medications  ketorolac (TORADOL) 15 MG/ML injection 15 mg (15 mg Intramuscular Given 07/03/22 1529)    ED Course/ Medical Decision Making/ A&P                             Medical Decision Making Amount and/or Complexity of Data Reviewed Labs: ordered. Decision-making details documented in ED Course.  Risk Prescription drug management.   Medical Decision Making:   Theodore Torres is a 27 y.o. male who presented to the ED today with back pain detailed above.    Additional history discussed with patient's family/caregivers.  Complete initial physical exam performed, notably the patient  was in no acute distress, not ill appearing, neurologically intact with normal sensation and 5/5 strength to bilateral UE/LE, no midline spinal tenderness or deformities, tenderness and spasming over right paraspinous lumbar muscles.  No meningismus. Reviewed and confirmed nursing documentation for past medical history, family history, social history.    Initial Assessment:   With the patient's presentation of back pain, most likely diagnosis is lumbar strain with sciatica. The emergent differential diagnosis for back pain includes but is not limited to fracture, muscle strain, cauda equina, spinal stenosis. DDD, ankylosing spondylitis, acute ligamentous injury, disk herniation, spondylolisthesis, Epidural compression syndrome, metastatic cancer, transverse myelitis, vertebral osteomyelitis, diskitis, kidney stone, pyelonephritis, AAA, perforated ulcer, retrocecal appendicitis, pancreatitis, bowel obstruction, retroperitoneal hemorrhage or mass, meningitis. . These are considered less likely due to history of present illness and physical exam findings.   This is most consistent with an acute complicated illness  Initial Plan:  Risks/benefits discussed with patient  regarding imaging -- he declines further imaging at this time and will return or follow up with PCP for any continued symptoms Dose of Toradol for pain control UA ordered in triage Objective evaluation as reviewed   Initial Study Results:   Laboratory  UA pending at time of discharge. Pt states he would like to leave. He denies any urinary symptoms, fever, or abdominal pain on repeat exam.   Final Assessment and Plan:   This is a 27 year old male presenting to ED with right lower back pain. Pain radiates around right buttock, hip, and thigh, but otherwise pt has no associated symptoms and pain  onset following mechanical injury. No red flag symptoms/history for back pain. Pt has reassuring physical exam with no spinal bony tenderness or stepoffs, benign abdominal exam, and he is neurologically intact with stable, steady gait and normal motor function. Tenderness over right paraspinous muscles. Suspect acute lumbar strain with right sciatica. Discussed reassuring history and exam with pt/family as well as risks/benefits of further workup including imaging. Pt would like to opt for conservative treatment with NSAIDs and muscle relaxers at home and will return or follow up as needed. Dose of Toradol given in ED. Pt rechecked and would like to be discharged home and not wait to see if medication helps pain before being discharged. He remains neurologically intact. UA ordered in triage pending but pt denies fever, abdominal pain, or urinary symptoms. He is ok  not waiting on this test. Will discharge home with Flexeril, NSAIDs, strict return precautions and outpatient follow up as indicated. Pt/family expressed understanding of plan. All questions answered and pt stable for discharge.    Clinical Impression:  1. Strain of lumbar region, initial encounter   2. Sciatica of left side      Data Unavailable           Final Clinical Impression(s) / ED Diagnoses Final diagnoses:  Strain of lumbar  region, initial encounter  Sciatica of left side    Rx / DC Orders ED Discharge Orders          Ordered    cyclobenzaprine (FLEXERIL) 10 MG tablet  3 times daily PRN        07/03/22 1523    ibuprofen (ADVIL) 600 MG tablet  Every 8 hours PRN        07/03/22 1523              Suzzette Righter, PA-C 07/03/22 1545    Wyvonnia Dusky, MD 07/03/22 1906

## 2022-07-03 NOTE — ED Triage Notes (Signed)
Patient here POV from Home.  Endorses Right Flank Pain that began 2 Weeks ago. States he was moving a Box 2 Weeks ago when it became wedged to the Location.   No Dysuria. No Hematuria. No Fevers. No N/V/D.   NAD Noted during Triage. A&Ox4. GCS 15. Ambulatory.

## 2022-07-03 NOTE — Discharge Instructions (Addendum)
Thank you for letting us take care of you today.   We gave you a medication called Toradol to help your pain in the ED. I am prescribing you a muscle relaxer called Flexeril and want you to take these as prescribed in addition to high dose ibuprofen as prescribed over the next few days to help with your symptoms.   You may also benefit from the exercises attached. I recommend trying these as tolerated over the next few days. It is important to stay active to keep the muscles in your back from tightening up further which can worsen your pain and make it last longer.   If your pain is not getting significantly better, please see your PCP next week for recheck. If you develop fever, abdominal pain, vomiting, urinary symptoms, urinating or defecating on yourself, weakness in your legs, or any other new or concerning symptoms, please be re-evaluated in the nearest emergency department.

## 2022-07-06 ENCOUNTER — Other Ambulatory Visit: Payer: Self-pay | Admitting: Family Medicine

## 2023-01-29 ENCOUNTER — Ambulatory Visit: Payer: Medicaid Other | Admitting: Family

## 2023-01-29 NOTE — Progress Notes (Deleted)
Patient ID: Fraser Din, male    DOB: 1996/01/24, 27 y.o.   MRN: [id number]  No chief complaint on file.   HPI:   Assessment & Plan:  There are no diagnoses linked to this encounter.  Subjective:    Outpatient Medications Prior to Visit  Medication Sig Dispense Refill   valACYclovir (VALTREX) 500 MG tablet Take 1 tablet (500 mg total) by mouth 2 (two) times daily. For 3 days 12 tablet 5   No facility-administered medications prior to visit.   Past Medical History:  Diagnosis Date   Asthma    Past Surgical History:  Procedure Laterality Date   WISDOM TOOTH EXTRACTION     No Known Allergies    Objective:    Physical Exam Vitals and nursing note reviewed.  Constitutional:      General: He is not in acute distress.    Appearance: Normal appearance.  HENT:     Head: Normocephalic.  Cardiovascular:     Rate and Rhythm: Normal rate and regular rhythm.  Pulmonary:     Effort: Pulmonary effort is normal.     Breath sounds: Normal breath sounds.  Musculoskeletal:        General: Normal range of motion.     Cervical back: Normal range of motion.  Skin:    General: Skin is warm and dry.  Neurological:     Mental Status: He is alert and oriented to person, place, and time.  Psychiatric:        Mood and Affect: Mood normal.    There were no vitals taken for this visit. Wt Readings from Last 3 Encounters:  07/03/22 140 lb (63.5 kg)  03/20/21 134 lb (60.8 kg)  09/26/20 129 lb 9.6 oz (58.8 kg)       Dulce Sellar, NP

## 2023-01-30 ENCOUNTER — Ambulatory Visit: Payer: Medicaid Other | Admitting: Family

## 2023-01-30 VITALS — BP 130/78 | Ht 67.0 in | Wt 144.4 lb

## 2023-01-30 DIAGNOSIS — R4586 Emotional lability: Secondary | ICD-10-CM | POA: Diagnosis not present

## 2023-01-30 DIAGNOSIS — A6001 Herpesviral infection of penis: Secondary | ICD-10-CM | POA: Diagnosis not present

## 2023-01-30 MED ORDER — VALACYCLOVIR HCL 500 MG PO TABS: 500.0000 mg | ORAL_TABLET | Freq: Two times a day (BID) | ORAL | 5 refills | Status: DC

## 2023-01-30 NOTE — Progress Notes (Signed)
Patient ID: Theodore Torres, male    DOB: 02/23/1996, 27 y.o.   MRN: [id number]  Chief Complaint  Patient presents with   Herpes simplex infection of penis    Medication refill    HPI: Herpes Simplex - pt last seen in 2022 reports taking  Valtrex 500 mg BID as needed Reports having 1 flares in the past 6 months.  Patient uses protection during intercourse. He is in a new relationship & GF is present with him today. He states he does not need/want daily suppression at this point.  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  -     valACYclovir HCl; Take 1 tablet (500 mg total) by mouth 2 (two) times daily. For 3 days  Dispense: 12 tablet; Refill: 5  Mood changes - pt indicated SI several days on PHQ scale. He reports no SI today. He reports he has ups and downs and just going thru a down time right now. GF present today is supportive. Pt reports he is working & just likes to stay busy on off days, has a group of friends that he is close to & are also supportive. Has had therapy in past but not very helpful. Advised on trying different therapists to find one that he is comfortable with, discussed briefly medications as a tx also. Pt to consider and let us know if we can help. Advised on emergency mental health number to call if needed.   Subjective:    Outpatient Medications Prior to Visit  Medication Sig Dispense Refill   valACYclovir (VALTREX) 500 MG tablet Take 1 tablet (500 mg total) by mouth 2 (two) times daily. For 3 days 12 tablet 5   No facility-administered medications prior to visit.   Past Medical History:  Diagnosis Date   Asthma    Past Surgical History:  Procedure Laterality Date   WISDOM TOOTH EXTRACTION     No Known Allergies    Objective:    Physical Exam Vitals and nursing note reviewed.  Constitutional:      General: He is not in acute distress.     Appearance: Normal appearance.  HENT:     Head: Normocephalic.  Cardiovascular:     Rate and Rhythm: Normal rate and regular rhythm.  Pulmonary:     Effort: Pulmonary effort is normal.     Breath sounds: Normal breath sounds.  Musculoskeletal:        General: Normal range of motion.     Cervical back: Normal range of motion.  Skin:    General: Skin is warm and dry.  Neurological:     Mental Status: He is alert and oriented to person, place, and time.  Psychiatric:        Mood and Affect: Mood normal.    BP 130/78   Ht 5\' 7"  (1.702 m)   Wt 144 lb 6.4 oz (65.5 kg)   BMI 22.62 kg/m  Wt Readings from Last 3 Encounters:  01/30/23 144 lb 6.4 oz (65.5 kg)  07/03/22 140 lb (63.5 kg)  03/20/21 134 lb (60.8 kg)      Dulce Sellar, NP

## 2023-08-05 ENCOUNTER — Encounter: Payer: Self-pay | Admitting: Family Medicine

## 2023-08-05 ENCOUNTER — Ambulatory Visit: Payer: Medicaid Other | Admitting: Family Medicine

## 2023-08-05 VITALS — BP 127/73 | HR 63 | Temp 97.8°F | Ht 67.0 in | Wt 144.0 lb

## 2023-08-05 DIAGNOSIS — L639 Alopecia areata, unspecified: Secondary | ICD-10-CM

## 2023-08-05 DIAGNOSIS — Z130 Encounter for screening for diseases of the blood and blood-forming organs and certain disorders involving the immune mechanism: Secondary | ICD-10-CM | POA: Diagnosis not present

## 2023-08-05 DIAGNOSIS — Z0000 Encounter for general adult medical examination without abnormal findings: Secondary | ICD-10-CM

## 2023-08-05 DIAGNOSIS — A6001 Herpesviral infection of penis: Secondary | ICD-10-CM

## 2023-08-05 DIAGNOSIS — Z13220 Encounter for screening for lipoid disorders: Secondary | ICD-10-CM | POA: Diagnosis not present

## 2023-08-05 LAB — CBC WITH DIFFERENTIAL/PLATELET
Basophils Absolute: 0 10*3/uL (ref 0.0–0.1)
Basophils Relative: 0.3 % (ref 0.0–3.0)
Eosinophils Absolute: 0 10*3/uL (ref 0.0–0.7)
Eosinophils Relative: 0.7 % (ref 0.0–5.0)
HCT: 41.4 % (ref 39.0–52.0)
Hemoglobin: 13.6 g/dL (ref 13.0–17.0)
Lymphocytes Relative: 25.2 % (ref 12.0–46.0)
Lymphs Abs: 1.5 10*3/uL (ref 0.7–4.0)
MCHC: 33 g/dL (ref 30.0–36.0)
MCV: 88.7 fl (ref 78.0–100.0)
Monocytes Absolute: 0.3 10*3/uL (ref 0.1–1.0)
Monocytes Relative: 5.6 % (ref 3.0–12.0)
Neutro Abs: 4.1 10*3/uL (ref 1.4–7.7)
Neutrophils Relative %: 68.2 % (ref 43.0–77.0)
Platelets: 286 10*3/uL (ref 150.0–400.0)
RBC: 4.67 Mil/uL (ref 4.22–5.81)
RDW: 13.7 % (ref 11.5–15.5)
WBC: 6 10*3/uL (ref 4.0–10.5)

## 2023-08-05 LAB — COMPREHENSIVE METABOLIC PANEL WITH GFR
ALT: 9 U/L (ref 0–53)
AST: 13 U/L (ref 0–37)
Albumin: 4.9 g/dL (ref 3.5–5.2)
Alkaline Phosphatase: 49 U/L (ref 39–117)
BUN: 20 mg/dL (ref 6–23)
CO2: 28 meq/L (ref 19–32)
Calcium: 9.6 mg/dL (ref 8.4–10.5)
Chloride: 105 meq/L (ref 96–112)
Creatinine, Ser: 1.05 mg/dL (ref 0.40–1.50)
GFR: 98.31 mL/min (ref >60.00)
Glucose, Bld: 91 mg/dL (ref 70–99)
Potassium: 3.9 meq/L (ref 3.5–5.1)
Sodium: 139 meq/L (ref 135–145)
Total Bilirubin: 0.3 mg/dL (ref 0.2–1.2)
Total Protein: 7.6 g/dL (ref 6.0–8.3)

## 2023-08-05 LAB — LIPID PANEL
Cholesterol: 176 mg/dL (ref 0–200)
HDL: 61.9 mg/dL (ref >39.00)
LDL Cholesterol: 102 mg/dL — ABNORMAL HIGH (ref 0–99)
NonHDL: 113.99
Total CHOL/HDL Ratio: 3
Triglycerides: 61 mg/dL (ref 0.0–149.0)
VLDL: 12.2 mg/dL (ref 0.0–40.0)

## 2023-08-05 LAB — TSH: TSH: 0.36 u[IU]/mL (ref 0.35–5.50)

## 2023-08-05 MED ORDER — VALACYCLOVIR HCL 500 MG PO TABS: 500.0000 mg | ORAL_TABLET | Freq: Two times a day (BID) | ORAL | 5 refills | Status: AC

## 2023-08-05 NOTE — Progress Notes (Signed)
 Phone: [phone]    Subjective:  Patient presents today for their annual physical. Chief complaint-noted.   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    Diabetes Maternal Grandmother    Breast cancer Maternal Grandmother    Diabetes Maternal Grandfather    Breast cancer Paternal Grandmother     Medications- reviewed and updated Current Outpatient Medications  Medication Sig Dispense Refill   valACYclovir (VALTREX) 500 MG tablet Take 1 tablet (500 mg total) by mouth 2 (two) times daily. For 3 days 12 tablet 5   No current facility-administered medications for this visit.    Allergies-reviewed and updated No Known Allergies  Social History   Social History Narrative   Single. Sexually active- protected       Had been doing some constructive work, moving into [employer] in 2025- not long term plan   - still making music    Finished HS.       Hobbies: enjoyed writing in the past, basketball, "chase girls", time with friends      Objective:  BP 127/73   Pulse 63   Temp 97.8 F (36.6 C)   Ht 5\' 7"  (1.702 m)   Wt 144 lb (65.3 kg)   SpO2  100%   BMI 22.55 kg/m  Gen: NAD, resting comfortably HEENT: Mucous membranes are moist. Oropharynx normal Neck: no thyromegaly CV: RRR no murmurs rubs or gallops Lungs: CTAB no crackles, wheeze, rhonchi Abdomen: soft/nontender/nondistended/normal bowel sounds. No rebound or guarding.  Ext: no edema Skin: warm, dry, hair loss on scalp and eyebrows Neuro: grossly normal, moves all extremities, PERRLA    Assessment and Plan:   28 y.o. male presenting for annual physical.  Health Maintenance counseling: 1. Anticipatory guidance: Patient counseled regarding regular dental exams -q6 months, eye exams - plans to update exam,  avoiding smoking and second hand smoke , limiting alcohol to 2 beverages per day- 6 a week perhaps, no illicit drugs- other than marijuana- doesn't sit well with edibles.   2. Risk factor reduction:  Advised patient of need for regular exercise and diet rich and fruits and vegetables to reduce risk of heart attack and stroke.  Exercise- basketball , sit up and push ups regimen- wants to get into a gym and bulk up some.  Diet/weight management-very healthy weight- tries to eat reasonably health- discussed trying to lower processed foods in general.  Wt Readings from Last 3 Encounters:  08/05/23 144 lb (65.3 kg)  01/30/23 144 lb 6.4 oz (  65.5 kg)  07/03/22 140 lb (63.5 kg)  3. Immunizations/screenings/ancillary studies- holding off on COVID. Not a candidate for pneumonia.  Immunization History  Administered Date(s) Administered   DTaP 10/19/1997, 12/01/1997, 01/23/1998, 11/10/1998, 11/22/2002   DTaP / Hep B / IPV 10/19/1997, 12/01/1997, 06/13/1998, 11/22/2002   DTaP / HiB 10/19/1997, 12/01/1997, 11/10/1998   HIB (PRP-OMP) 10/19/1997, 12/01/1997, 11/10/1998   HPV Quadrivalent 03/04/2014, 03/07/2014, 07/29/2014   Hepatitis A, Ped/Adol-2 Dose 11/18/2005, 03/23/2007   Hepatitis B 09/08/97, 09/05/1997, 06/23/1998   Hepatitis B, PED/ADOLESCENT 06-Feb-1998, 09/05/1997,  06/13/1998   IPV 10/19/1997, 12/01/1997, 06/13/1998, 11/22/2002   Influenza Split 03/02/2012   Influenza Whole 02/29/2008   Influenza,inj,Quad PF,6+ Mos 01/26/2013, 02/10/2015   Influenza,inj,quad, With Preservative 03/07/2014   MMR 08/25/1998, 11/22/2002   Meningococcal Conjugate 09/28/2008, 07/29/2014   Pneumococcal Polysaccharide-23 08/25/1998, 11/10/1998   Tdap 09/28/2008, 03/20/2021   Varicella 08/25/1998, 03/23/2007  4. Prostate cancer screening- no family history, start at age 76   5. Colon cancer screening - no family history, start at age 80  6. Skin cancer screening/prevention- lower risk due to melanin content. advised regular sunscreen use. Denies worrisome, changing, or new skin lesions.  7. Testicular cancer screening- advised monthly self exams - does these regularly 8. STD screening- patient opts out- same girlfriend as last 4 years - no concern infility 9. Smoking associated screening- never smoker- don't need urinalysis   Status of chronic or acute concerns   #Opts out of labs at this point- lipid panel looked pretty good in 2022, last CBC 2017, could do CMP but prefers to hol doffu  # Herpes simplex-has Valtrex on hand as needed-he is not interested in suppression - more likely to flare with high stress. Less than every few months  Recommended follow up: Return in about 1 year (around 08/04/2024) for physical or sooner if needed.Schedule b4 you leave.  Lab/Order associations: fasting   ICD-10-CM   1. Preventative health care  Z00.00     2. Screening for hyperlipidemia  Z13.220 Comprehensive metabolic panel with GFR    Lipid panel    3. Screening for deficiency anemia  Z13.0 CBC with Differential/Platelet    4. Herpes simplex infection of penis  A60.01 valACYclovir (VALTREX) 500 MG tablet      Meds ordered this encounter  Medications   valACYclovir (VALTREX) 500 MG tablet    Sig: Take 1 tablet (500 mg total) by mouth 2 (two) times daily. For 3 days     Dispense:  12 tablet    Refill:  5    Return precautions advised.   Tana Conch, MD

## 2023-08-05 NOTE — Patient Instructions (Addendum)
 Please stop by lab before you go If you have mychart- we will send your results within 3 business days of [place] receiving them.  If you do not have mychart- we will call you about results within 5 business days of [place] receiving them.  *please also note that you will see labs on mychart as soon as they post. I will later go in and write notes on them- will say "notes from Dr. Durene Cal"   Recommend updating dental exam  Update eye exam  We have placed a referral for you today to dermatology Dr. Onalee Hua- please call their # if you do not hear within a week (may be listed below or you may see mychart message within a few days with #).    Recommended follow up: Return in about 1 year (around 08/04/2024) for physical or sooner if needed.Schedule b4 you leave. Or 2 years if everything stable

## 2023-08-13 ENCOUNTER — Telehealth: Payer: Self-pay

## 2023-08-13 NOTE — Telephone Encounter (Signed)
 Noted.   Copied from CRM [id number]. Topic: Clinical - Lab/Test Results >> Aug 13, 2023 10:17 AM [place] J wrote: Reason for CRM: Patient calling in for lab results. Results were relayed.

## 2023-09-09 ENCOUNTER — Telehealth: Payer: Self-pay | Admitting: Family Medicine

## 2023-09-09 NOTE — Telephone Encounter (Unsigned)
 Copied from CRM [id number]. Topic: Referral - Question >> Sep 09, 2023  9:25 AM Allyne Areola wrote: Reason for CRM: Patient's mother is calling to request a change in the referral to Dermatology that was placed on 08/05/2023. She would like for the referral to go to [facility] as there is a provider they would like to see. [facility] [phone]

## 2023-11-28 ENCOUNTER — Telehealth: Payer: Self-pay | Admitting: Family Medicine

## 2023-11-28 ENCOUNTER — Ambulatory Visit: Payer: Self-pay

## 2023-11-28 NOTE — Telephone Encounter (Signed)
   FYI Only or Action Required?: FYI only for provider.  Patient was last seen in primary care on 08/05/2023 by [name], MD.  Called Nurse Triage reporting Back Pain.  Symptoms began off and on for the past year and a half--worse lately--woke up today worse.  Interventions attempted: Rest, hydration, or home remedies and Other: Pain medication.  Symptoms are: gradually worsening.  Triage Disposition: See HCP Within 4 Hours (Or PCP Triage)  Patient/caregiver understands and will follow disposition?: Yes---PATIENT GOING TO URGENT CARE DUE TO NO AVAILABLE APPOINTMENTS IN PCP OFFICE                          Copied from CRM #[id number]. Topic: Clinical - Red Word Triage >> Nov 28, 2023  8:22 AM Theodore Torres wrote: Red Word that prompted transfer to Nurse Triage: Patient stated that he has severe lower back pain, unable to walk, bend down, or even put on his clothes. Reason for Disposition  [1] SEVERE back pain (e.g., excruciating, unable to do any normal activities) AND [2] not improved 2 hours after pain medicine  Answer Assessment - Initial Assessment Questions Patient did say that the pain was in his right lower side of his back---unsure if it is kidney area or muscular Patient advised to get it checked out today in the next few hours as the recommendation With no openings in PCP office, patient states he will go to Urgent Care He was initially wondering if [name] would possibly be able to refer him to a chiropractor but he states he was hurting so bad he was going to Urgent Care to be evaluated today   1. ONSET: When did the pain begin? (e.g., minutes, hours, days)     A year and a half ago it started and it's been off and on ever since after working with [employer] and hurting back and work 2. LOCATION: Where does it hurt? (upper, mid or lower back)     Lower 3. SEVERITY: How bad is the pain?  (e.g., Scale 1-10; mild, moderate, or severe)      10 4. PATTERN: Is the pain constant? (e.g., yes, no; constant, intermittent)      Off and on 5. RADIATION: Does the pain shoot into your legs or somewhere else?     Down right 6. CAUSE:  What do you think is causing the back pain?      unknown 7. BACK OVERUSE:  Any recent lifting of heavy objects, strenuous work or exercise?     Working 8. MEDICINES: What have you taken so far for the pain? (e.g., nothing, acetaminophen , NSAIDS)     Pain medicine 9. NEUROLOGIC SYMPTOMS: Do you have any weakness, numbness, or problems with bowel/bladder control?     No 10. OTHER SYMPTOMS: Do you have any other symptoms? (e.g., fever, abdomen pain, burning with urination, blood in urine)       No  Protocols used: Back Pain-A-AH

## 2023-11-28 NOTE — Telephone Encounter (Signed)
 Please see patient request.    Copied from CRM #[id number]. Topic: Referral - Question >> Nov 28, 2023  8:38 AM Laymon HERO wrote: Reason for CRM: Patient asking for a referral to Dr Malcolm at [facility]. He is going to the ER today due to lower back pain

## 2023-11-28 NOTE — Telephone Encounter (Signed)
 Is that at all [place] offices?

## 2023-11-28 NOTE — Telephone Encounter (Signed)
 Left vm for pt regarding return call. If patient calls back please schedule with Dr. Kennyth one day next week for back pain/ possible referral. Okay per dr. Katrinka.

## 2023-11-28 NOTE — Telephone Encounter (Signed)
 We need to see evaluation first and see if that is recommended - neurology for back pain is not always the right first step

## 2023-11-28 NOTE — Telephone Encounter (Signed)
 FYI pt going to urgent care due to no availability at the office

## 2023-12-02 ENCOUNTER — Ambulatory Visit: Admitting: Family Medicine

## 2023-12-03 ENCOUNTER — Encounter: Payer: Self-pay | Admitting: Family

## 2023-12-03 ENCOUNTER — Ambulatory Visit: Admitting: Family Medicine

## 2023-12-03 ENCOUNTER — Ambulatory Visit: Admitting: Family

## 2023-12-03 VITALS — BP 131/78 | HR 56 | Temp 98.2°F | Ht 67.0 in | Wt 147.0 lb

## 2023-12-03 DIAGNOSIS — M545 Low back pain, unspecified: Secondary | ICD-10-CM

## 2023-12-03 MED ORDER — METHOCARBAMOL 500 MG PO TABS: 500.0000 mg | ORAL_TABLET | Freq: Four times a day (QID) | ORAL | 0 refills | Status: AC

## 2023-12-03 MED ORDER — LIDOCAINE 4 % EX PTCH: 1.0000 | MEDICATED_PATCH | CUTANEOUS | 2 refills | Status: AC

## 2023-12-03 MED ORDER — NAPROXEN 500 MG PO TABS: 500.0000 mg | ORAL_TABLET | Freq: Two times a day (BID) | ORAL | 0 refills | Status: AC

## 2023-12-03 NOTE — Progress Notes (Signed)
 Patient ID: Theodore Torres, male    DOB: 1995/12/21, 28 y.o.   MRN: [id number]  Chief Complaint  Patient presents with   Back Pain    Pt c/o lower back, present since last Thursday. A box fell on pt back over a year ago. Has tried naproxen  and percocet, not prescribed and pt is unsure of dose.   Discussed the use of AI scribe software for clinical note transcription with the patient, who gave verbal consent to proceed.  History of Present Illness Theodore Torres is a 28 year old male who presents with lower back pain.  Lumbar pain - Onset after a box fell on him a year ago, did not seek tx or imaging and pain resolved - Last week lifted a heavy box which initially did not seem bad impacting the right side, but has been worse each day after and now spreading all across the lower back - Pain described as achy - Pain radiated down the right leg initially, causing weakness and shakiness, but this has resolved - No current radiation, numbness, or tingling - Pain is relieved by sitting straight up - Pain is worsened by leaning back or sleeping on the right side - Heat therapy provides some relief; ice has not been used - Uses over-the-counter naproxen  and someone gave him a Percocet for pain management but usually avoids medication due to sensitivity - Used a muscle relaxer patch once without significant relief  Functional impairment - Recent work at [employer] may have aggravated the pain - Difficulty navigating stairs due to pain - Left job at [employer] due to physical demands  Assessment & Plan Acute low back pain  Acute low back pain in lower right lumbar region, initially from trauma a year ago, recovered, but worsened by lifting last week at work. Pain localized to back, aggravated by certain positions. No numbness or tingling, initial leg weakness noted. Sensitive to medications. - Prescribed naproxen  for anti-inflammatory effect x7d. - Prescribed Methocarbamol  500mg  qid with caution;  advised trying at home first, consider splitting pill if too strong causing drowsiness. - Recommended heat therapy for 30 minutes several times a day. - Suggested over-the-counter lidocaine  patches for localized pain relief, sending as RX, but can get OTC - Advised alternating ice and heat therapy as needed. - Discussed using pillows to prevent rolling onto right side during sleep. - Call office back if still no improvement in sx.    Subjective:    Outpatient Medications Prior to Visit  Medication Sig Dispense Refill   valACYclovir  (VALTREX ) 500 MG tablet Take 1 tablet (500 mg total) by mouth 2 (two) times daily. For 3 days 12 tablet 5   No facility-administered medications prior to visit.   Past Medical History:  Diagnosis Date   Asthma    Past Surgical History:  Procedure Laterality Date   WISDOM TOOTH EXTRACTION     Allergies  Allergen Reactions   Amoxicillin Dermatitis    REACTION AS A CHILD      Objective:    Physical Exam Vitals and nursing note reviewed.  Constitutional:      General: He is not in acute distress.    Appearance: Normal appearance.  HENT:     Head: Normocephalic.  Cardiovascular:     Rate and Rhythm: Normal rate and regular rhythm.  Pulmonary:     Effort: Pulmonary effort is normal.     Breath sounds: Normal breath sounds.  Musculoskeletal:     Cervical back: Normal range  of motion.     Thoracic back: Normal.     Lumbar back: No swelling, signs of trauma, spasms, tenderness or bony tenderness. Decreased range of motion.  Skin:    General: Skin is warm and dry.  Neurological:     Mental Status: He is alert and oriented to person, place, and time.  Psychiatric:        Mood and Affect: Mood normal.    BP 131/78 (BP Location: Left Arm, Patient Position: Sitting, Cuff Size: Large)   Pulse (!) 56   Temp 98.2 F (36.8 C) (Temporal)   Ht 5' 7 (1.702 m)   Wt 147 lb (66.7 kg)   SpO2 100%   BMI 23.02 kg/m  Wt Readings from Last 3  Encounters:  12/03/23 147 lb (66.7 kg)  08/05/23 144 lb (65.3 kg)  01/30/23 144 lb 6.4 oz (65.5 kg)      Lucius Krabbe, NP

## 2023-12-10 ENCOUNTER — Ambulatory Visit (INDEPENDENT_AMBULATORY_CARE_PROVIDER_SITE_OTHER): Admitting: Family

## 2023-12-10 ENCOUNTER — Encounter: Payer: Self-pay | Admitting: Family

## 2023-12-10 VITALS — BP 118/84 | HR 60 | Temp 97.3°F | Ht 67.0 in | Wt 145.4 lb

## 2023-12-10 DIAGNOSIS — M545 Low back pain, unspecified: Secondary | ICD-10-CM

## 2023-12-10 NOTE — Progress Notes (Signed)
 Patient ID: Theodore Torres, male    DOB: 17-Jan-1996, 28 y.o.   MRN: [id number]  Chief Complaint  Patient presents with   Back Pain    Pt c/o lower back pain worsening. Has tried robaxin  and naproxen  which does not help sx.   Discussed the use of AI scribe software for clinical note transcription with the patient, who gave verbal consent to proceed.  History of Present Illness Theodore Torres is a 28 year old male who presents with severe low back pain and leg weakness. He is accompanied by his mother.  Low back pain - Severe low back pain for two days - Initially localized to the right side, now central - Constant and sharp in character - Pain worsens with sitting or lying down - Movement and comfort are difficult - No new injuries or activities  Lower extremity neurological symptoms - Episode of leg weakness following a short car ride, states legs gave out on him - Tingling and 'pins and needles' sensations in both legs - Symptoms worse than usual  Analgesic and muscle relaxant use - Takes methocarbamol  daily, causing drowsiness - Uses naproxen  twice daily for pain - Applies topical cream and Icy Hot with assistance from his GF, who has been alternating ice application with heat. - Lidocaine  patches not used due to cost  Assessment & Plan Low back pain with lumbar radiculopathy Chronic low back pain with acute exacerbation and lumbar radiculopathy. Seen in office a week ago, and now having severe pain radiates to legs with paresthesia. The patient reported drowsiness from methocarbamol  and preferred this effect as it allowed him to sleep through the pain. Differential includes muscle strain; further evaluation needed. - Continue methocarbamol  as prescribed, up to two tablets for severe pain. - Continue naproxen  twice daily. - Advise lidocaine  patches or cream for localized pain relief, available over-the-counter. - Refer to orthopedic specialist for further evaluation and  imaging.  Subjective:    Outpatient Medications Prior to Visit  Medication Sig Dispense Refill   lidocaine  4 % Place 1 patch onto the skin daily. 15 patch 2   valACYclovir  (VALTREX ) 500 MG tablet Take 1 tablet (500 mg total) by mouth 2 (two) times daily. For 3 days 12 tablet 5   methocarbamol  (ROBAXIN ) 500 MG tablet Take 1-2 tablets (500-1,000 mg total) by mouth 4 (four) times daily. For Back pain. May cause drowsiness. Can take 2 pills at bedtime. (Patient not taking: Reported on 12/10/2023) 60 tablet 0   naproxen  (NAPROSYN ) 500 MG tablet Take 1 tablet (500 mg total) by mouth 2 (two) times daily with a meal. Take for 1 week or until back pain is much better. (Patient not taking: Reported on 12/10/2023) 30 tablet 0   No facility-administered medications prior to visit.   Past Medical History:  Diagnosis Date   Asthma    Past Surgical History:  Procedure Laterality Date   WISDOM TOOTH EXTRACTION     Allergies  Allergen Reactions   Amoxicillin Dermatitis    REACTION AS A CHILD      Objective:    Physical Exam Vitals and nursing note reviewed.  Constitutional:      General: He is not in acute distress.    Appearance: Normal appearance.  HENT:     Head: Normocephalic.  Cardiovascular:     Rate and Rhythm: Normal rate and regular rhythm.  Pulmonary:     Effort: Pulmonary effort is normal.     Breath sounds: Normal breath sounds.  Musculoskeletal:        General: Normal range of motion.     Cervical back: Normal range of motion.  Skin:    General: Skin is warm and dry.  Neurological:     Mental Status: He is alert and oriented to person, place, and time.  Psychiatric:        Mood and Affect: Mood normal.    BP 118/84 (BP Location: Left Arm, Patient Position: Sitting, Cuff Size: Large)   Pulse 60   Temp (!) 97.3 F (36.3 C) (Temporal)   Ht 5' 7 (1.702 m)   Wt 145 lb 6 oz (65.9 kg)   SpO2 100%   BMI 22.77 kg/m  Wt Readings from Last 3 Encounters:  12/10/23 145 lb 6  oz (65.9 kg)  12/03/23 147 lb (66.7 kg)  08/05/23 144 lb (65.3 kg)      Lucius Krabbe, NP

## 2023-12-25 ENCOUNTER — Encounter: Payer: Self-pay | Admitting: Physical Medicine and Rehabilitation

## 2023-12-25 ENCOUNTER — Ambulatory Visit: Admitting: Physical Medicine and Rehabilitation

## 2023-12-25 ENCOUNTER — Other Ambulatory Visit (INDEPENDENT_AMBULATORY_CARE_PROVIDER_SITE_OTHER): Payer: Self-pay

## 2023-12-25 DIAGNOSIS — M5416 Radiculopathy, lumbar region: Secondary | ICD-10-CM | POA: Diagnosis not present

## 2023-12-25 DIAGNOSIS — M7918 Myalgia, other site: Secondary | ICD-10-CM

## 2023-12-25 DIAGNOSIS — M5441 Lumbago with sciatica, right side: Secondary | ICD-10-CM

## 2023-12-25 MED ORDER — PREDNISONE 50 MG PO TABS: 50.0000 mg | ORAL_TABLET | Freq: Every day | ORAL | 0 refills | Status: AC

## 2023-12-25 MED ORDER — MELOXICAM 15 MG PO TABS: 15.0000 mg | ORAL_TABLET | Freq: Every day | ORAL | 0 refills | Status: AC

## 2023-12-25 NOTE — Progress Notes (Signed)
 Pain Scale   Average Pain 10  Lower back pain Started 1.5 years ago, worsened over last month  Box fell on him 1.5 years ago Radiating down into right leg  Stabbing pain  No numbness or tingling  Naproxen , heat and ice, icy hot- some relief    No x-rays, MRI

## 2023-12-25 NOTE — Progress Notes (Signed)
 Theodore Torres - 28 y.o. male MRN [id number]  Date of birth: 1995-10-22  Office Visit Note: Visit Date: 12/25/2023 PCP: [name], MD Referred by: Lucius Krabbe, NP  Subjective: Chief Complaint  Patient presents with   Lower Back - Pain   HPI: Theodore Torres is a 28 y.o. male who comes in today per the request of Krabbe Lucius, NP for evaluation of acute bilateral lower back pain radiating to right buttock and down right lateral thigh to knee. Pain started about 1 month ago after lifting box while working at [employer]. His pain initially improved shortly after lifting box, however his pain recently increased about 2 days ago. His pain worsens with prolonged sitting. He describes pain as sore, aching and tight sensation, currently rates as 10 out of 10. Some relief of pain with home exercise regimen, rest and use of medications. No relief of pain with Robaxin  and Naproxen . No history of formal physical therapy. Patient denies focal weakness, numbness and tingling. No recent trauma or falls. He recently started a new job and would like to arrange for lumbar brace.      Review of Systems  Musculoskeletal:  Positive for back pain.  Neurological:  Negative for tingling, sensory change, focal weakness and weakness.  All other systems reviewed and are negative.  Otherwise per HPI.  Assessment & Plan: Visit Diagnoses:    ICD-10-CM   1. Acute bilateral low back pain with right-sided sciatica  M54.41 XR Lumbar Spine 2-3 Views    MR LUMBAR SPINE WO CONTRAST    2. Radiculopathy, lumbar region  M54.16 XR Lumbar Spine 2-3 Views    MR LUMBAR SPINE WO CONTRAST    3. Myofascial pain syndrome  M79.18        Plan: Findings:  Acute bilateral lower back pain radiating to buttock and down right lateral thigh to knee. Patient continues to have severe pain despite good conservative therapies such as home exercise regimen, rest and use of medications. Patients clinical presentation and  exam are complex, differentials include discogenic issue vs lumbar myofascial strain. We discussed treatment plan in detail today. I prescribed both oral Prednisone  and Mobic . She can start the Mobic  after he completes short course of Prednisone . Given his radicular symptoms and continued severe pain I placed order for lumbar MRI imaging. Depending on results of MRI imaging we discussed possibility of performing lumbar epidural steroid injection. I do think he would benefit from lumbar brace, especially when working. I did contact Aleck with [facility] [employer] to arrange for brace fitting. I will see him back for lumbar MRI review. He has no questions at this time. No red flag symptoms noted upon exam today.             Meds & Orders:  Meds ordered this encounter  Medications   predniSONE  (DELTASONE ) 50 MG tablet    Sig: Take 1 tablet (50 mg total) by mouth daily with breakfast. Take until completed.    Dispense:  5 tablet    Refill:  0   meloxicam  (MOBIC ) 15 MG tablet    Sig: Take 1 tablet (15 mg total) by mouth daily.    Dispense:  30 tablet    Refill:  0    Orders Placed This Encounter  Procedures   XR Lumbar Spine 2-3 Views   MR LUMBAR SPINE WO CONTRAST    Follow-up: Return for Lumbar MRI review.   Procedures: No procedures performed      Clinical  History: No specialty comments available.   He reports that he has never smoked. He has never used smokeless tobacco. No results for input(s): HGBA1C, LABURIC in the last 8760 hours.  Objective:  VS:  HT:    WT:   BMI:     BP:   HR: bpm  TEMP: ( )  RESP:  Physical Exam Vitals and nursing note reviewed.  HENT:     Head: Normocephalic and atraumatic.     Right Ear: External ear normal.     Left Ear: External ear normal.     Nose: Nose normal.     Mouth/Throat:     Mouth: Mucous membranes are moist.  Eyes:     Extraocular Movements: Extraocular movements intact.  Cardiovascular:     Rate and Rhythm: Normal rate.      Pulses: Normal pulses.  Pulmonary:     Effort: Pulmonary effort is normal.  Abdominal:     General: Abdomen is flat. There is no distension.  Musculoskeletal:        General: Tenderness present.     Cervical back: Normal range of motion.     Comments: Patient rises from seated position to standing without difficulty. Good lumbar range of motion. No pain noted with facet loading. 5/5 strength noted with bilateral hip flexion, knee flexion/extension, ankle dorsiflexion/plantarflexion and EHL. No clonus noted bilaterally. No pain upon palpation of greater trochanters. No pain with internal/external rotation of bilateral hips. Sensation intact bilaterally. Positive slump test on the right. Ambulates without aid, gait steady.     Skin:    General: Skin is warm and dry.     Capillary Refill: Capillary refill takes less than 2 seconds.  Neurological:     General: No focal deficit present.     Mental Status: He is alert and oriented to person, place, and time.  Psychiatric:        Mood and Affect: Mood normal.        Behavior: Behavior normal.     Ortho Exam  Imaging: XR Lumbar Spine 2-3 Views Result Date: 12/25/2023 AP and lateral radiographs of lumbar spine show transitional anatomy. Well preserved disc spacing, no spondylolisthesis. No pars defects. No fractures or dislocations.    Past Medical/Family/Surgical/Social History: Medications & Allergies reviewed per EMR, new medications updated. 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 plans to stop.    Sexual activity: Yes    Birth control/protection: Condom

## 2023-12-30 ENCOUNTER — Telehealth: Payer: Self-pay | Admitting: Physical Medicine and Rehabilitation

## 2023-12-30 ENCOUNTER — Encounter: Payer: Self-pay | Admitting: Physical Medicine and Rehabilitation

## 2023-12-30 NOTE — Telephone Encounter (Signed)
 Pt called stating he need a letter that his employer is asking that he is ok to work woth no restrictions. Please call pt when ready for pick up. Pt phone number is [phone].

## 2023-12-30 NOTE — Telephone Encounter (Signed)
 Patient notified about letter in [facility]

## 2024-01-08 ENCOUNTER — Encounter: Payer: Self-pay | Admitting: Physical Medicine and Rehabilitation

## 2024-01-11 ENCOUNTER — Ambulatory Visit
Admission: RE | Admit: 2024-01-11 | Discharge: 2024-01-11 | Disposition: A | Discharge status: Home / Self Care | Source: Ambulatory Visit | Attending: Physical Medicine and Rehabilitation | Admitting: Physical Medicine and Rehabilitation

## 2024-01-11 DIAGNOSIS — M5441 Lumbago with sciatica, right side: Secondary | ICD-10-CM

## 2024-01-11 DIAGNOSIS — M5416 Radiculopathy, lumbar region: Secondary | ICD-10-CM

## 2024-02-02 ENCOUNTER — Ambulatory Visit: Admitting: Physical Medicine and Rehabilitation

## 2024-03-08 ENCOUNTER — Encounter: Payer: Self-pay | Admitting: Radiology

## 2024-04-06 ENCOUNTER — Ambulatory Visit: Admitting: Dermatology

## 2024-04-06 ENCOUNTER — Encounter: Payer: Self-pay | Admitting: Dermatology

## 2024-04-06 VITALS — BP 115/71 | HR 69

## 2024-04-06 DIAGNOSIS — Z79899 Other long term (current) drug therapy: Secondary | ICD-10-CM

## 2024-04-06 DIAGNOSIS — L639 Alopecia areata, unspecified: Secondary | ICD-10-CM | POA: Diagnosis not present

## 2024-04-06 MED ORDER — TRIAMCINOLONE ACETONIDE 0.1 % EX OINT: 1.0000 | TOPICAL_OINTMENT | Freq: Every day | CUTANEOUS | 6 refills | Status: AC | PRN

## 2024-04-06 NOTE — Patient Instructions (Addendum)
 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 We reviewed your recent lab results, which were normal, and discussed new treatment options.  YOUR PLAN:  -ALOPECIA AREATA:  Alopecia areata is a condition that causes hair loss in round patches. It is often related to genetics and can be influenced by the immune system.   We have decided to start you on a new treatment with a medication called Litfulo, which helps control inflammation and allows hair to regrow.   Before starting this medication, you will need to complete screening tests for TB and hepatitis.   You will also use triamcinolone cream on the affected areas, applying a thin layer morning and night for two weeks on, two weeks off.  INSTRUCTIONS:  Please complete the TB and hepatitis screening labs as soon as possible. We have scheduled a follow-up appointment in five months to assess your hair regrowth. Ensure you apply the triamcinolone cream as directed.    Important Information   Due to recent changes in healthcare laws, you may see results of your pathology and/or laboratory studies on MyChart before the doctors have had a chance to review them. We understand that in some cases there may be results that are confusing or concerning to you. Please understand that not all results are received at the same time and often the doctors may need to interpret multiple results in order to provide you with the best plan of care or course of treatment. Therefore, we ask that you please give us  2 business days to thoroughly review all your results before contacting the office for clarification. Should we see a critical lab result, you will be contacted sooner.     If You Need Anything After Your Visit   If you have any questions or concerns for your doctor, please call our main line at [phone]. If no one answers, please leave a voicemail  as directed and we will return your call as soon as possible. Messages left after 4 pm will be answered the following business day.    You may also send us  a message via MyChart. We typically respond to MyChart messages within 1-2 business days.  For prescription refills, please ask your pharmacy to contact our office. Our fax number is [phone].  If you have an urgent issue when the clinic is closed that cannot wait until the next business day, you can page your doctor at the number below.     Please note that while we do our best to be available for urgent issues outside of office hours, we are not available 24/7.    If you have an urgent issue and are unable to reach us , you may choose to seek medical care at your doctor's office, retail clinic, urgent care center, or emergency room.   If you have a medical emergency, please immediately call 911 or go to the emergency department. In the event of inclement weather, please call our main line at [phone] for an update on the status of any delays or closures.  Dermatology Medication Tips: Please keep the boxes that topical medications come in in order to help keep track of the instructions about where and how to use these. Pharmacies typically print the medication instructions only on the boxes and not directly on the medication tubes.   If your medication is too expensive, please contact our office at [phone] or send us   a message through MyChart.    We are unable to tell what your co-pay for medications will be in advance as this is different depending on your insurance coverage. However, we may be able to find a substitute medication at lower cost or fill out paperwork to get insurance to cover a needed medication.    If a prior authorization is required to get your medication covered by your insurance company, please allow us  1-2 business days to complete this process.   Drug prices often vary depending on where the  prescription is filled and some pharmacies may offer cheaper prices.   The website www.goodrx.com contains coupons for medications through different pharmacies. The prices here do not account for what the cost may be with help from insurance (it may be cheaper with your insurance), but the website can give you the price if you did not use any insurance.  - You can print the associated coupon and take it with your prescription to the pharmacy.  - You may also stop by our office during regular business hours and pick up a GoodRx coupon card.  - If you need your prescription sent electronically to a different pharmacy, notify our office through [facility] or by phone at [phone]

## 2024-04-06 NOTE — Progress Notes (Unsigned)
   New Patient Visit   Subjective  Theodore Torres is a 28 y.o. 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 Patient reports the areas have been there for several years. He reports the areas are bothersome. He states his scalp can be itch. Patient rates irritation 5-6 out of 10. Patient reports he  has not previously been treated for these areas. Patient denies Hx of bx. Patient reports family history of hair loss.  Patient provided verbal consent for the use of an AI-assisted program to generate a detailed after-visit summary. The patient understands that the AI tool is used to support clinical documentation and that all information will be reviewed and verified by the healthcare provider.  The following portions of the chart were reviewed this encounter and updated as appropriate: medications, allergies, medical history  Review of Systems:  No other skin or systemic complaints except as noted in HPI or Assessment and Plan.  Objective  Well appearing patient in no apparent distress; mood and affect are within normal limits.  A focused examination was performed of the following areas: Scalp and face  Relevant exam findings are noted in the Assessment and Plan.              Assessment & Plan  Alopecia Areata Exam: Isolated patches of hair loss SALT Score: 80  Treatment Plan: - Labs WNL - Litfulo samples provided while in office today - Plan to obtain labs (Quantiferon & Hep Panel) - Prescribed Triamcinolone  0.1% Cream to Apply 2 times daily for 2 weeks then STOP - Plan to follow up in     No follow-ups on file.  I, Jetta Ager, am acting as neurosurgeon for [employer], DO.  Documentation: I have reviewed the above documentation for accuracy and completeness, and I agree with the above.  Delon Lenis, DO

## 2024-08-05 ENCOUNTER — Encounter: Admitting: Family Medicine

## 2024-09-14 ENCOUNTER — Ambulatory Visit: Admitting: Dermatology

## 2024-10-06 ENCOUNTER — Encounter: Admitting: Family Medicine
